# Patient Record
Sex: Female | Born: 1960 | Race: Black or African American | Hispanic: No | Marital: Single | Smoking: Never smoker
Health system: Southern US, Community
[De-identification: ages and names within clinical notes are randomized; demographics above are authoritative.]

## PROBLEM LIST (undated history)

## (undated) DIAGNOSIS — C569 Malignant neoplasm of unspecified ovary: Secondary | ICD-10-CM

## (undated) HISTORY — PX: OTHER SURGICAL HISTORY: SHX169

## (undated) HISTORY — PX: MYOMECTOMY: SHX85

## (undated) HISTORY — PX: COLON RESECTION: SHX5231

## (undated) HISTORY — PX: DILATION AND CURETTAGE OF UTERUS: SHX78

---

## 2021-01-12 ENCOUNTER — Encounter (HOSPITAL_COMMUNITY): Payer: Self-pay | Admitting: *Deleted

## 2021-01-12 ENCOUNTER — Other Ambulatory Visit: Payer: Self-pay

## 2021-01-12 ENCOUNTER — Emergency Department (HOSPITAL_COMMUNITY): Payer: Medicare Other

## 2021-01-12 ENCOUNTER — Inpatient Hospital Stay (HOSPITAL_COMMUNITY)
Admission: EM | Admit: 2021-01-12 | Discharge: 2021-01-17 | DRG: 193 | Disposition: A | Discharge status: Hospice | Payer: Medicare Other | Attending: Internal Medicine | Admitting: Internal Medicine

## 2021-01-12 DIAGNOSIS — C770 Secondary and unspecified malignant neoplasm of lymph nodes of head, face and neck: Secondary | ICD-10-CM | POA: Diagnosis present

## 2021-01-12 DIAGNOSIS — Y95 Nosocomial condition: Secondary | ICD-10-CM | POA: Diagnosis present

## 2021-01-12 DIAGNOSIS — Z881 Allergy status to other antibiotic agents status: Secondary | ICD-10-CM

## 2021-01-12 DIAGNOSIS — Z79899 Other long term (current) drug therapy: Secondary | ICD-10-CM

## 2021-01-12 DIAGNOSIS — K219 Gastro-esophageal reflux disease without esophagitis: Secondary | ICD-10-CM | POA: Diagnosis present

## 2021-01-12 DIAGNOSIS — J189 Pneumonia, unspecified organism: Principal | ICD-10-CM | POA: Diagnosis present

## 2021-01-12 DIAGNOSIS — Z803 Family history of malignant neoplasm of breast: Secondary | ICD-10-CM

## 2021-01-12 DIAGNOSIS — Z882 Allergy status to sulfonamides status: Secondary | ICD-10-CM

## 2021-01-12 DIAGNOSIS — J9601 Acute respiratory failure with hypoxia: Secondary | ICD-10-CM | POA: Diagnosis present

## 2021-01-12 DIAGNOSIS — C785 Secondary malignant neoplasm of large intestine and rectum: Secondary | ICD-10-CM | POA: Diagnosis present

## 2021-01-12 DIAGNOSIS — E785 Hyperlipidemia, unspecified: Secondary | ICD-10-CM | POA: Diagnosis present

## 2021-01-12 DIAGNOSIS — I313 Pericardial effusion (noninflammatory): Secondary | ICD-10-CM | POA: Diagnosis present

## 2021-01-12 DIAGNOSIS — K5090 Crohn's disease, unspecified, without complications: Secondary | ICD-10-CM | POA: Diagnosis present

## 2021-01-12 DIAGNOSIS — F419 Anxiety disorder, unspecified: Secondary | ICD-10-CM | POA: Diagnosis present

## 2021-01-12 DIAGNOSIS — R1310 Dysphagia, unspecified: Secondary | ICD-10-CM | POA: Diagnosis present

## 2021-01-12 DIAGNOSIS — D709 Neutropenia, unspecified: Secondary | ICD-10-CM | POA: Diagnosis present

## 2021-01-12 DIAGNOSIS — Z20822 Contact with and (suspected) exposure to covid-19: Secondary | ICD-10-CM | POA: Diagnosis present

## 2021-01-12 DIAGNOSIS — Z91048 Other nonmedicinal substance allergy status: Secondary | ICD-10-CM

## 2021-01-12 DIAGNOSIS — Z8249 Family history of ischemic heart disease and other diseases of the circulatory system: Secondary | ICD-10-CM

## 2021-01-12 DIAGNOSIS — R590 Localized enlarged lymph nodes: Secondary | ICD-10-CM | POA: Diagnosis present

## 2021-01-12 DIAGNOSIS — Z66 Do not resuscitate: Secondary | ICD-10-CM | POA: Diagnosis present

## 2021-01-12 DIAGNOSIS — Z7951 Long term (current) use of inhaled steroids: Secondary | ICD-10-CM

## 2021-01-12 DIAGNOSIS — C7800 Secondary malignant neoplasm of unspecified lung: Secondary | ICD-10-CM | POA: Diagnosis present

## 2021-01-12 DIAGNOSIS — Z933 Colostomy status: Secondary | ICD-10-CM

## 2021-01-12 DIAGNOSIS — C773 Secondary and unspecified malignant neoplasm of axilla and upper limb lymph nodes: Secondary | ICD-10-CM | POA: Diagnosis present

## 2021-01-12 DIAGNOSIS — I248 Other forms of acute ischemic heart disease: Secondary | ICD-10-CM | POA: Diagnosis present

## 2021-01-12 DIAGNOSIS — Z515 Encounter for palliative care: Secondary | ICD-10-CM

## 2021-01-12 DIAGNOSIS — R000 Tachycardia, unspecified: Secondary | ICD-10-CM | POA: Diagnosis present

## 2021-01-12 DIAGNOSIS — Z823 Family history of stroke: Secondary | ICD-10-CM

## 2021-01-12 DIAGNOSIS — Z8543 Personal history of malignant neoplasm of ovary: Secondary | ICD-10-CM

## 2021-01-12 DIAGNOSIS — Z888 Allergy status to other drugs, medicaments and biological substances status: Secondary | ICD-10-CM

## 2021-01-12 HISTORY — DX: Malignant neoplasm of unspecified ovary: C56.9

## 2021-01-12 LAB — COMPREHENSIVE METABOLIC PANEL
ALT: 18 U/L (ref 0–44)
AST: 25 U/L (ref 15–41)
Albumin: 2.6 g/dL — ABNORMAL LOW (ref 3.5–5.0)
Alkaline Phosphatase: 74 U/L (ref 38–126)
Anion gap: 11 (ref 5–15)
BUN: 5 mg/dL — ABNORMAL LOW (ref 8–23)
CO2: 25 mmol/L (ref 22–32)
Calcium: 8.9 mg/dL (ref 8.9–10.3)
Chloride: 99 mmol/L (ref 98–111)
Creatinine, Ser: 0.6 mg/dL (ref 0.44–1.00)
GFR, Estimated: >60 mL/min (ref >60)
Glucose, Bld: 97 mg/dL (ref 70–99)
Potassium: 3.9 mmol/L (ref 3.5–5.1)
Sodium: 135 mmol/L (ref 135–145)
Total Bilirubin: 0.5 mg/dL (ref 0.3–1.2)
Total Protein: 6.9 g/dL (ref 6.5–8.1)

## 2021-01-12 LAB — CBC WITH DIFFERENTIAL/PLATELET
Abs Immature Granulocytes: 0.04 10*3/uL (ref 0.00–0.07)
Basophils Absolute: 0 10*3/uL (ref 0.0–0.1)
Basophils Relative: 0 %
Eosinophils Absolute: 0 10*3/uL (ref 0.0–0.5)
Eosinophils Relative: 0 %
HCT: 45.4 % (ref 36.0–46.0)
Hemoglobin: 14.2 g/dL (ref 12.0–15.0)
Immature Granulocytes: 1 %
Lymphocytes Relative: 6 %
Lymphs Abs: 0.4 10*3/uL — ABNORMAL LOW (ref 0.7–4.0)
MCH: 28.2 pg (ref 26.0–34.0)
MCHC: 31.3 g/dL (ref 30.0–36.0)
MCV: 90.1 fL (ref 80.0–100.0)
Monocytes Absolute: 0.8 10*3/uL (ref 0.1–1.0)
Monocytes Relative: 11 %
Neutro Abs: 6.1 10*3/uL (ref 1.7–7.7)
Neutrophils Relative %: 82 %
Platelets: 310 10*3/uL (ref 150–400)
RBC: 5.04 MIL/uL (ref 3.87–5.11)
RDW: 15.6 % — ABNORMAL HIGH (ref 11.5–15.5)
WBC: 7.5 10*3/uL (ref 4.0–10.5)
nRBC: 0 % (ref 0.0–0.2)

## 2021-01-12 LAB — TROPONIN I (HIGH SENSITIVITY): Troponin I (High Sensitivity): 31 ng/L — ABNORMAL HIGH (ref <18)

## 2021-01-12 LAB — PROTIME-INR
INR: 1.2 (ref 0.8–1.2)
Prothrombin Time: 14.7 seconds (ref 11.4–15.2)

## 2021-01-12 LAB — LACTIC ACID, PLASMA: Lactic Acid, Venous: 1.6 mmol/L (ref 0.5–1.9)

## 2021-01-12 MED ORDER — ONDANSETRON HCL 4 MG/2ML IJ SOLN
4.0000 mg | Freq: Once | INTRAMUSCULAR | Status: AC
  Administered 2021-01-13: 4 mg via INTRAVENOUS
  Filled 2021-01-12: qty 2

## 2021-01-12 MED ORDER — HYDROMORPHONE HCL 1 MG/ML IJ SOLN
1.0000 mg | Freq: Once | INTRAMUSCULAR | Status: AC
  Administered 2021-01-13: 1 mg via INTRAVENOUS
  Filled 2021-01-12: qty 1

## 2021-01-12 MED ORDER — LACTATED RINGERS IV BOLUS
1000.0000 mL | Freq: Once | INTRAVENOUS | Status: AC
  Administered 2021-01-13: 1000 mL via INTRAVENOUS

## 2021-01-12 NOTE — ED Provider Notes (Signed)
[facility] EMERGENCY DEPARTMENT Provider Note   CSN: [id number] Arrival date & time: 01/12/21  2116     History Chief Complaint  Patient presents with   Shortness of Breath   Level 5 caveat due to acuity of condition Chelsea Mcintosh is a 60 y.o. female.  The history is provided by the patient and a parent.  Shortness of Breath Severity:  Severe Onset quality:  Gradual Duration:  3 months Timing:  Constant Progression:  Worsening Chronicity:  Recurrent Relieved by:  Nothing Worsened by:  Nothing Associated symptoms: chest pain and cough   Associated symptoms: no fever   Patient with history of metastatic ovarian cancer presents with shortness of breath and chest pain.  Patient reports he has had the symptoms ongoing for several months.  She just relocated from [place] here to [place] in the past week.  She reports hospitalist was post to visit her but they did not show up.  She reports that she is on home oxygen    Past Medical History:  Diagnosis Date   Ovarian cancer ([facility] [facility])       OB History   No obstetric history on file.     No family history on file.     Home Medications Prior to Admission medications   Not on File    Allergies    Patient has no allergy information on record.  Review of Systems   Review of Systems  Unable to perform ROS: Acuity of condition  Constitutional:  Negative for fever.  Respiratory:  Positive for cough and shortness of breath.   Cardiovascular:  Positive for chest pain.   Physical Exam Updated Vital Signs BP (!) 133/91   Pulse (!) 125   Temp 98 F (36.7 C) (Oral)   Resp 20   SpO2 98%   Physical Exam CONSTITUTIONAL: Ill-appearing, uncomfortable appearing HEAD: Normocephalic/atraumatic EYES: EOMI/PERRL ENMT: Mucous membranes moist NECK: supple no meningeal signs SPINE/BACK:entire spine nontender CV: S1/S2 noted, tachycardic LUNGS: Tachypneic, coarse breath sounds bilaterally ABDOMEN:  soft, nontender, no rebound or guarding, colostomy noted NEURO: Pt is awake/alert/appropriate, moves all extremitiesx4.  No facial droop.   EXTREMITIES: pulses normal/equal, full ROM SKIN: warm, color normal PSYCH: Anxious  ED Results / Procedures / Treatments   Labs (all labs ordered are listed, but only abnormal results are displayed) Labs Reviewed  COMPREHENSIVE METABOLIC PANEL - Abnormal; Notable for the following components:      Result Value   BUN 5 (*)    Albumin 2.6 (*)    All other components within normal limits  CBC WITH DIFFERENTIAL/PLATELET - Abnormal; Notable for the following components:   RDW 15.6 (*)    Lymphs Abs 0.4 (*)    All other components within normal limits  TROPONIN I (HIGH SENSITIVITY) - Abnormal; Notable for the following components:   Troponin I (High Sensitivity) 31 (*)    All other components within normal limits  CULTURE, BLOOD (ROUTINE X 2)  CULTURE, BLOOD (ROUTINE X 2)  RESP PANEL BY RT-PCR (FLU A&B, COVID) ARPGX2  LACTIC ACID, PLASMA  BRAIN NATRIURETIC PEPTIDE  PROTIME-INR    EKG EKG Interpretation  Date/Time:  Wednesday January 12 2021 22:00:01 EDT Ventricular Rate:  125 PR Interval:  134 QRS Duration: 60 QT Interval:  300 QTC Calculation: 433 R Axis:   91 Text Interpretation: Sinus tachycardia Rightward axis Low voltage QRS Septal infarct , age undetermined Abnormal ECG No previous ECGs available Confirmed by Ripley Fraise [phone]) on 01/12/2021 11:36:16  PM  Radiology DG Chest 2 View  Result Date: 01/12/2021 CLINICAL DATA:  History of stage IV ovarian cancer presenting with shortness of breath. EXAM: CHEST - 2 VIEW COMPARISON:  None. FINDINGS: A right-sided venous Port-A-Cath is seen with its distal tip noted within the right atrium. This is approximately 2.5 cm distal to the junction of the superior vena cava and right atrium. Innumerable ill-defined multifocal opacities are seen scattered throughout both lungs. Mild atelectasis and/or  infiltrate is also seen within the lateral aspect of the right lung base. A very small right pleural effusion is seen. No pneumothorax is identified. The heart size and mediastinal contours are within normal limits. The visualized skeletal structures are unremarkable. IMPRESSION: 1. Findings which may represent marked severity bilateral multifocal infiltrates. Given the patient's history of stage IV ovarian cancer, sequelae associated with diffuse pulmonary metastasis cannot be excluded. 2. Mild right basilar atelectasis and/or infiltrate with a very small right pleural effusion. Electronically Signed   By: Virgina Norfolk M.D.   On: 01/12/2021 22:41    Procedures .Critical Care  Date/Time: 01/13/2021 12:59 AM Performed by: Ripley Fraise, MD Authorized by: Ripley Fraise, MD   Critical care provider statement:    Critical care time (minutes):  31   Critical care start time:  01/13/2021 12:59 AM   Critical care end time:  01/13/2021 1:30 AM   Critical care time was exclusive of:  Separately billable procedures and treating other patients   Critical care was necessary to treat or prevent imminent or life-threatening deterioration of the following conditions:  Respiratory failure   Critical care was time spent personally by me on the following activities:  Development of treatment plan with patient or surrogate, discussions with consultants, evaluation of patient's response to treatment, examination of patient, re-evaluation of patient's condition, ordering and review of radiographic studies, ordering and review of laboratory studies, pulse oximetry and ordering and performing treatments and interventions   I assumed direction of critical care for this patient from another provider in my specialty: no     Care discussed with: admitting provider     Medications Ordered in ED Medications  Ampicillin-Sulbactam (UNASYN) 3 g in sodium chloride 0.9 % 100 mL IVPB (has no administration in time range)   lactated ringers bolus 1,000 mL (1,000 mLs Intravenous New Bag/Given 01/13/21 0025)  HYDROmorphone (DILAUDID) injection 1 mg (1 mg Intravenous Given 01/13/21 0019)  ondansetron (ZOFRAN) injection 4 mg (4 mg Intravenous Given 01/13/21 0019)  LORazepam (ATIVAN) injection 0.5 mg (0.5 mg Intravenous Given 01/13/21 0058)    ED Course  I have reviewed the triage vital signs and the nursing notes.  Pertinent labs & imaging results that were available during my care of the patient were reviewed by me and considered in my medical decision making (see chart for details).    MDM Rules/Calculators/A&P                           Patient with history of metastatic ovarian cancer presents with shortness of breath.  She reports symptoms been ongoing for several months.  She reports significant pain in her chest and back but this is not a new finding.  Patient appears very uncomfortable. She does appear to have diffuse metastatic disease on chest x-ray that I personally reviewed Plan to treat her pain and reassess. 1:00 AM Patient is now improving.  Patient reports she has a history of metastatic ovarian cancer.  She has had  extensive evaluation and management in [place].  She reports she was recently admitted to the hospital [place] was found to have a "lung infection" after thoracentesis and has been on IV antibiotics.  She also had a new oxygen requirement while in hospital.  The decision was made by patient to relocate to [place] to be with her family.  She was discharged home on Augmentin and she reports that she intended to be seen by hospice but that never occurred.  She is also not on home oxygen as of yet. She reports as far she knows she intends to be on continued cancer therapy but is also amenable to palliative consult.  Her records has not come through yet on care everywhere.  We will transition from Augmentin to IV Unasyn.  Patient does have a new oxygen requirement as she initially arrived  satting on percent on room air that this is likely related to her metastasis/infection rather than acute PE. Overall patient appears more comfortable after receiving IV pain medicines. 2:04 AM Discussed with Dr. Nevada Crane for admission. Overall patient feels more comfortable.  COVID testing is pending at this time Final Clinical Impression(s) / ED Diagnoses Final diagnoses:  Acute respiratory failure with hypoxia ([place])  Secondary carcinoma of lung, unspecified laterality [facility])    Rx / DC Orders ED Discharge Orders     None        Ripley Fraise, MD 01/13/21 0205

## 2021-01-12 NOTE — ED Provider Notes (Signed)
Emergency Medicine Provider Triage Evaluation Note  Chelsea Mcintosh , a 60 y.o. female  was evaluated in triage.  Pt complains of shortness of breath, going on for last couple months, she has stage IV ovarian cancer, currently relocating from [place] down to [place].  She was supposed to follow-up with hospice care but was unable to do so, currently a full code, she states that she is currently being treated for pneumonia, she is on amoxicillin at this time, she is not on oxygen at home, she was found in triage today on room air with a sat of 79%, currently on 99% room air 4 L via nasal cannula.  Review of Systems  Positive: Shortness of breath, back pain Negative: Chest pain, abdominal pain  Physical Exam  BP (!) 120/91 (BP Location: Left Arm)   Pulse (!) 130   Resp (!) 24   SpO2 94%  Gen:   Awake, no distress   Resp:  Normal effort  MSK:   Moves extremities without difficulty  Other:  Patient has tight sounding chest, slight basilar Rales in the lower lobes  Medical Decision Making  Medically screening exam initiated at 9:50 PM.  Appropriate orders placed.  KARENZA ELBON was informed that the remainder of the evaluation will be completed by another provider, this initial triage assessment does not replace that evaluation, and the importance of remaining in the ED until their evaluation is complete.  Patient presented with shortness of breath, will make her a level 2 recommend immediate rooming as the patient will need further evaluation.   Marcello Fennel, PA-C 01/12/21 2154    Pattricia Boss, MD 01/12/21 [phone]

## 2021-01-12 NOTE — ED Notes (Signed)
I attempted to get and IV on pt, but unable to. Another nurse to access pt's port. Pt's pain 10/10. Pt was supposed to get set up with hospice, but they never showed up. Pt AxO x4.

## 2021-01-12 NOTE — ED Triage Notes (Signed)
Pt reports she has shortness of breath tonight. She has hx of metastatic ovarian CA, in hospice care. On current abx for infection in her lungs.  Arrives on RA, 79% RA reported by first nurse tech, placed on oxygen. Suppose to be on oxygen, but not using it.   99% on 4 liters, hr 130 in triage, MSE complete by PA.

## 2021-01-13 ENCOUNTER — Inpatient Hospital Stay (HOSPITAL_COMMUNITY): Payer: Medicare Other

## 2021-01-13 DIAGNOSIS — J189 Pneumonia, unspecified organism: Secondary | ICD-10-CM | POA: Diagnosis present

## 2021-01-13 DIAGNOSIS — Z515 Encounter for palliative care: Secondary | ICD-10-CM | POA: Diagnosis not present

## 2021-01-13 DIAGNOSIS — Z823 Family history of stroke: Secondary | ICD-10-CM | POA: Diagnosis not present

## 2021-01-13 DIAGNOSIS — C7800 Secondary malignant neoplasm of unspecified lung: Secondary | ICD-10-CM | POA: Diagnosis present

## 2021-01-13 DIAGNOSIS — E785 Hyperlipidemia, unspecified: Secondary | ICD-10-CM | POA: Diagnosis present

## 2021-01-13 DIAGNOSIS — Y95 Nosocomial condition: Secondary | ICD-10-CM | POA: Diagnosis present

## 2021-01-13 DIAGNOSIS — Z888 Allergy status to other drugs, medicaments and biological substances status: Secondary | ICD-10-CM | POA: Diagnosis not present

## 2021-01-13 DIAGNOSIS — R0602 Shortness of breath: Secondary | ICD-10-CM

## 2021-01-13 DIAGNOSIS — C773 Secondary and unspecified malignant neoplasm of axilla and upper limb lymph nodes: Secondary | ICD-10-CM | POA: Diagnosis present

## 2021-01-13 DIAGNOSIS — F419 Anxiety disorder, unspecified: Secondary | ICD-10-CM | POA: Diagnosis present

## 2021-01-13 DIAGNOSIS — R778 Other specified abnormalities of plasma proteins: Secondary | ICD-10-CM | POA: Diagnosis not present

## 2021-01-13 DIAGNOSIS — C569 Malignant neoplasm of unspecified ovary: Secondary | ICD-10-CM | POA: Diagnosis not present

## 2021-01-13 DIAGNOSIS — I248 Other forms of acute ischemic heart disease: Secondary | ICD-10-CM | POA: Diagnosis present

## 2021-01-13 DIAGNOSIS — Z20822 Contact with and (suspected) exposure to covid-19: Secondary | ICD-10-CM | POA: Diagnosis present

## 2021-01-13 DIAGNOSIS — K5090 Crohn's disease, unspecified, without complications: Secondary | ICD-10-CM | POA: Diagnosis present

## 2021-01-13 DIAGNOSIS — C770 Secondary and unspecified malignant neoplasm of lymph nodes of head, face and neck: Secondary | ICD-10-CM | POA: Diagnosis present

## 2021-01-13 DIAGNOSIS — Z8249 Family history of ischemic heart disease and other diseases of the circulatory system: Secondary | ICD-10-CM | POA: Diagnosis not present

## 2021-01-13 DIAGNOSIS — J9601 Acute respiratory failure with hypoxia: Secondary | ICD-10-CM | POA: Diagnosis present

## 2021-01-13 DIAGNOSIS — Z933 Colostomy status: Secondary | ICD-10-CM | POA: Diagnosis not present

## 2021-01-13 DIAGNOSIS — R079 Chest pain, unspecified: Secondary | ICD-10-CM | POA: Diagnosis not present

## 2021-01-13 DIAGNOSIS — Z803 Family history of malignant neoplasm of breast: Secondary | ICD-10-CM | POA: Diagnosis not present

## 2021-01-13 DIAGNOSIS — C801 Malignant (primary) neoplasm, unspecified: Secondary | ICD-10-CM | POA: Diagnosis not present

## 2021-01-13 DIAGNOSIS — Z882 Allergy status to sulfonamides status: Secondary | ICD-10-CM | POA: Diagnosis not present

## 2021-01-13 DIAGNOSIS — Z91048 Other nonmedicinal substance allergy status: Secondary | ICD-10-CM | POA: Diagnosis not present

## 2021-01-13 DIAGNOSIS — C785 Secondary malignant neoplasm of large intestine and rectum: Secondary | ICD-10-CM | POA: Diagnosis present

## 2021-01-13 DIAGNOSIS — Z66 Do not resuscitate: Secondary | ICD-10-CM | POA: Diagnosis present

## 2021-01-13 DIAGNOSIS — D709 Neutropenia, unspecified: Secondary | ICD-10-CM | POA: Diagnosis present

## 2021-01-13 DIAGNOSIS — C799 Secondary malignant neoplasm of unspecified site: Secondary | ICD-10-CM | POA: Diagnosis not present

## 2021-01-13 DIAGNOSIS — R1310 Dysphagia, unspecified: Secondary | ICD-10-CM | POA: Diagnosis present

## 2021-01-13 DIAGNOSIS — Z7189 Other specified counseling: Secondary | ICD-10-CM | POA: Diagnosis not present

## 2021-01-13 DIAGNOSIS — Z881 Allergy status to other antibiotic agents status: Secondary | ICD-10-CM | POA: Diagnosis not present

## 2021-01-13 DIAGNOSIS — I313 Pericardial effusion (noninflammatory): Secondary | ICD-10-CM | POA: Diagnosis present

## 2021-01-13 LAB — ECHOCARDIOGRAM COMPLETE
Area-P 1/2: 6.71 cm2
Calc EF: 67.9 %
Height: 63 in
S' Lateral: 2 cm
Single Plane A2C EF: 72.1 %
Single Plane A4C EF: 56 %
Weight: 2240 oz

## 2021-01-13 LAB — BASIC METABOLIC PANEL
Anion gap: 10 (ref 5–15)
BUN: <5 mg/dL — ABNORMAL LOW (ref 8–23)
CO2: 23 mmol/L (ref 22–32)
Calcium: 8.2 mg/dL — ABNORMAL LOW (ref 8.9–10.3)
Chloride: 102 mmol/L (ref 98–111)
Creatinine, Ser: 0.6 mg/dL (ref 0.44–1.00)
GFR, Estimated: >60 mL/min (ref >60)
Glucose, Bld: 101 mg/dL — ABNORMAL HIGH (ref 70–99)
Potassium: 3.8 mmol/L (ref 3.5–5.1)
Sodium: 135 mmol/L (ref 135–145)

## 2021-01-13 LAB — CBC
HCT: 40.5 % (ref 36.0–46.0)
Hemoglobin: 12.3 g/dL (ref 12.0–15.0)
MCH: 27.4 pg (ref 26.0–34.0)
MCHC: 30.4 g/dL (ref 30.0–36.0)
MCV: 90.2 fL (ref 80.0–100.0)
Platelets: 272 10*3/uL (ref 150–400)
RBC: 4.49 MIL/uL (ref 3.87–5.11)
RDW: 15.6 % — ABNORMAL HIGH (ref 11.5–15.5)
WBC: 7.7 10*3/uL (ref 4.0–10.5)
nRBC: 0 % (ref 0.0–0.2)

## 2021-01-13 LAB — BRAIN NATRIURETIC PEPTIDE: B Natriuretic Peptide: 73.4 pg/mL (ref 0.0–100.0)

## 2021-01-13 LAB — HIV ANTIBODY (ROUTINE TESTING W REFLEX): HIV Screen 4th Generation wRfx: NONREACTIVE

## 2021-01-13 LAB — RESP PANEL BY RT-PCR (FLU A&B, COVID) ARPGX2
Influenza A by PCR: NEGATIVE
Influenza B by PCR: NEGATIVE
SARS Coronavirus 2 by RT PCR: NEGATIVE

## 2021-01-13 LAB — TROPONIN I (HIGH SENSITIVITY): Troponin I (High Sensitivity): 18 ng/L — ABNORMAL HIGH (ref <18)

## 2021-01-13 LAB — PROCALCITONIN: Procalcitonin: <0.1 ng/mL

## 2021-01-13 MED ORDER — POLYETHYLENE GLYCOL 3350 17 G PO PACK: 17.0000 g | PACK | Freq: Every day | ORAL | Status: DC | PRN

## 2021-01-13 MED ORDER — SODIUM CHLORIDE 0.9 % IV SOLN
3.0000 g | Freq: Four times a day (QID) | INTRAVENOUS | Status: DC
  Administered 2021-01-13 – 2021-01-17 (×17): 3 g via INTRAVENOUS
  Filled 2021-01-13 (×3): qty 8
  Filled 2021-01-13: qty 3
  Filled 2021-01-13: qty 8
  Filled 2021-01-13 (×4): qty 3
  Filled 2021-01-13 (×11): qty 8

## 2021-01-13 MED ORDER — SODIUM CHLORIDE 0.9 % IV SOLN
3.0000 g | Freq: Once | INTRAVENOUS | Status: AC
  Administered 2021-01-13: 3 g via INTRAVENOUS
  Filled 2021-01-13: qty 8

## 2021-01-13 MED ORDER — NALOXONE HCL 0.4 MG/ML IJ SOLN: 0.4000 mg | INTRAMUSCULAR | Status: DC | PRN

## 2021-01-13 MED ORDER — LORAZEPAM 2 MG/ML IJ SOLN
0.5000 mg | Freq: Four times a day (QID) | INTRAMUSCULAR | Status: DC | PRN
  Administered 2021-01-13 – 2021-01-15 (×7): 0.5 mg via INTRAVENOUS
  Filled 2021-01-13 (×7): qty 1

## 2021-01-13 MED ORDER — LORAZEPAM 2 MG/ML IJ SOLN
0.5000 mg | Freq: Once | INTRAMUSCULAR | Status: AC
  Administered 2021-01-13: 0.5 mg via INTRAVENOUS
  Filled 2021-01-13: qty 1

## 2021-01-13 MED ORDER — OXYCODONE HCL 5 MG PO TABS
5.0000 mg | ORAL_TABLET | Freq: Four times a day (QID) | ORAL | Status: DC | PRN
  Administered 2021-01-14 (×3): 5 mg via ORAL
  Filled 2021-01-13 (×4): qty 1

## 2021-01-13 MED ORDER — ENOXAPARIN SODIUM 40 MG/0.4ML IJ SOSY
40.0000 mg | PREFILLED_SYRINGE | INTRAMUSCULAR | Status: DC
  Administered 2021-01-13 – 2021-01-17 (×5): 40 mg via SUBCUTANEOUS
  Filled 2021-01-13 (×5): qty 0.4

## 2021-01-13 MED ORDER — MELATONIN 3 MG PO TABS: 3.0000 mg | ORAL_TABLET | Freq: Every evening | ORAL | Status: DC | PRN

## 2021-01-13 MED ORDER — ACETAMINOPHEN 325 MG PO TABS: 650.0000 mg | ORAL_TABLET | Freq: Four times a day (QID) | ORAL | Status: DC | PRN

## 2021-01-13 MED ORDER — ONDANSETRON HCL 4 MG/2ML IJ SOLN: 4.0000 mg | Freq: Four times a day (QID) | INTRAMUSCULAR | Status: DC | PRN

## 2021-01-13 MED ORDER — HYDROMORPHONE HCL 1 MG/ML IJ SOLN
1.0000 mg | INTRAMUSCULAR | Status: DC | PRN
  Administered 2021-01-13 – 2021-01-16 (×26): 1 mg via INTRAVENOUS
  Filled 2021-01-13 (×27): qty 1

## 2021-01-13 MED ORDER — METOPROLOL TARTRATE 5 MG/5ML IV SOLN
2.5000 mg | Freq: Four times a day (QID) | INTRAVENOUS | Status: DC | PRN
  Administered 2021-01-15: 2.5 mg via INTRAVENOUS
  Filled 2021-01-13: qty 5

## 2021-01-13 NOTE — Progress Notes (Signed)
Patient ID: Chelsea Mcintosh, female   DOB: Oct 26, 1960, 60 y.o.   MRN: [id number] Patient was admitted early this morning for shortness of breath, chest and back pain.She was admitted for multifocal pneumonia and started on IV Unasyn.  Patient seen and examined at bedside and plan of care discussed with her.  I have reviewed patient's medical records including this morning's H&P, current vitals, labs, medications myself.  Continue oxygen supplementation and antibiotics.  Palliative care consultation for goals of care discussion as patient apparently was previously supposed to be enrolled to hospice care in [place].  Patient is currently full code.  Patient will need to establish outpatient oncology locally.

## 2021-01-13 NOTE — ED Notes (Signed)
Lights turned off for pt. Pt readjusted in bed. Denies further needs. Pt instructed to call if pain starts to get worse again. Waiting for unasyn from pharmacy.

## 2021-01-13 NOTE — ED Notes (Signed)
Pt requesting ativan for anxiety. Dr. Christy Gentles notified. See new orders. Dr. Also said pt can eat/drink. Given water.

## 2021-01-13 NOTE — H&P (Addendum)
History and Physical  Chelsea Mcintosh [id number] DOB: Oct 19, 1960 DOA: 01/12/2021  Referring physician: Dr. Christy Gentles, [place]. PCP: System, Provider Not In  Outpatient Specialists: Hematology/oncology Patient coming from: Home, lives with her parents.  Recently moved to [facility] 4 days prior to presentation on 01/10/2021.  Chief Complaint: Shortness of breath, chest and back pain.  HPI: Chelsea Mcintosh is a 60 y.o. female with medical history significant for ovarian cancer with diffuse metastasis previously under hospice care and recently admitted at outside facility in [place].  She states she was found to have pneumonia there, was seen by infectious disease and released from outside facility with Augmentin.  She was also told that she needed to be on 2 L oxygen via nasal cannula however was discharged without supplemental oxygen.  She moved to [facility] to be with her parents who assist in caring for her.  States she has 1 child, her son, who lives in [place] is an Alcoholic and Has Not Been Able to Assist with Her Care.  Upon arrival to the ED she was found to be hypoxic with O2 saturation in the high 70s on room air.  She was placed on 4 L with improvement to 99%.  Bilateral pulmonary infiltrates seen on CXR.  EDP requested admission for pneumonia and hypoxia.  ED Course:  Temperature 98.6.  BP 128/84, pulse 116, respiration rate 21, O2 saturation 96% on 4 L.  Review of Systems: Review of systems as noted in the HPI. All other systems reviewed and are negative.   Past Medical History:  Diagnosis Date   Ovarian cancer [facility])      Social History:  has no history on file for tobacco use, alcohol use, and drug use.   Family history: Her son is an alcoholic.   Prior to Admission medications   Not on File    Physical Exam: BP (!) 151/90 (BP Location: Right Arm)   Pulse (!) 120   Temp 98 F (36.7 C) (Oral)   Resp (!) 29   SpO2 97%   General: 60 y.o.  year-old female well developed well nourished in no acute distress.  Alert and oriented x3.  Conversational dyspnea noted. Cardiovascular: Regular rate and rhythm with no rubs or gallops.  No thyromegaly or JVD noted.  Trace lower extremity edema bilaterally.  Nontender with palpation.   Respiratory: Diffuse rales bilaterally.  No wheezing noted.  Poor inspiratory effort.   Abdomen: Soft nontender nondistended with normal bowel sounds x4 quadrants. Muskuloskeletal: No cyanosis or clubbing.  Trace lower extremity edema bilaterally.  Nontender with palpation. Neuro: CN II-XII intact, strength, sensation, reflexes Skin: No ulcerative lesions noted or rashes Psychiatry: Judgement and insight appear normal. Mood is appropriate for condition and setting          Labs on Admission:  Basic Metabolic Panel: Recent Labs  Lab 01/12/21 2207  NA 135  K 3.9  CL 99  CO2 25  GLUCOSE 97  BUN 5*  CREATININE 0.60  CALCIUM 8.9   Liver Function Tests: Recent Labs  Lab 01/12/21 2207  AST 25  ALT 18  ALKPHOS 74  BILITOT 0.5  PROT 6.9  ALBUMIN 2.6*   No results for input(s): LIPASE, AMYLASE in the last 168 hours. No results for input(s): AMMONIA in the last 168 hours. CBC: Recent Labs  Lab 01/12/21 2207  WBC 7.5  NEUTROABS 6.1  HGB 14.2  HCT 45.4  MCV 90.1  PLT 310   Cardiac Enzymes: No results  for input(s): CKTOTAL, CKMB, CKMBINDEX, TROPONINI in the last 168 hours.  BNP (last 3 results) Recent Labs    01/12/21 2207  BNP 73.4    ProBNP (last 3 results) No results for input(s): PROBNP in the last 8760 hours.  CBG: No results for input(s): GLUCAP in the last 168 hours.  Radiological Exams on Admission: DG Chest 2 View  Result Date: 01/12/2021 CLINICAL DATA:  History of stage IV ovarian cancer presenting with shortness of breath. EXAM: CHEST - 2 VIEW COMPARISON:  None. FINDINGS: A right-sided venous Port-A-Cath is seen with its distal tip noted within the right atrium. This  is approximately 2.5 cm distal to the junction of the superior vena cava and right atrium. Innumerable ill-defined multifocal opacities are seen scattered throughout both lungs. Mild atelectasis and/or infiltrate is also seen within the lateral aspect of the right lung base. A very small right pleural effusion is seen. No pneumothorax is identified. The heart size and mediastinal contours are within normal limits. The visualized skeletal structures are unremarkable. IMPRESSION: 1. Findings which may represent marked severity bilateral multifocal infiltrates. Given the patient's history of stage IV ovarian cancer, sequelae associated with diffuse pulmonary metastasis cannot be excluded. 2. Mild right basilar atelectasis and/or infiltrate with a very small right pleural effusion. Electronically Signed   By: Virgina Norfolk M.D.   On: 01/12/2021 22:41    EKG: I independently viewed the EKG done and my findings are as followed: Sinus tachycardia rate of 125, nonspecific ST-T changes.  QTc 433.  Assessment/Plan Present on Admission:  HCAP (healthcare-associated pneumonia)  Active Problems:   HCAP (healthcare-associated pneumonia)  Multifocal HCAP, POA Recently seen at outside facility due to similar symptoms Started on Augmentin there Chest x-ray with evidence of multifocal pneumonia. Started on Unasyn in the ED, continue. Obtain procalcitonin level Sputum culture Bronchodilators Antitussives as needed's Incentive spirometer  Acute hypoxic respiratory failure secondary to multifocal pneumonia Not on oxygen supplementation at baseline Currently on 4 L to maintain O2 saturation greater than 92% Wean off oxygen supplementation as tolerated Treat underlying conditions COVID-19 screening test negative. Consider CTA chest to rule out PE if no improvement  Ovarian cancer with mets to the lungs. Consult palliative care team to assist with establishing goals of care. Patient is currently a full  code Patient is planning on staying in [facility] hematology oncology in the morning. Obtain medical records from [place].  Elevated troponin, suspect demand ischemia in the setting of acute hypoxic respiratory failure secondary to multifocal pneumonia. Initial troponin 31 Repeat troponin No evidence of acute ischemia on twelve-lead EKG. Monitor on telemetry. Obtain 2D echo   To note, at the time of the dictation her home medications have not been reconciled.   Please resume home medications once reconciled.    DVT prophylaxis: Subcu Lovenox daily  Code Status: Full code  Family Communication: None at bedside  Disposition Plan: Admit to telemetry medical  Consults called: None  Admission status: Inpatient status.  Patient will require at least 2 midnights for further evaluation and treatment of present condition.   Status is: Inpatient    Dispo:  Patient From: Home  Planned Disposition: Home, possibly on 01/15/2021 or when symptomatology has improved.  Medically stable for discharge: No         Kayleen Memos MD [facility] Pager [phone]  If 7PM-7AM, please contact night-coverage [email or web address] Password [email or web address]  01/13/2021, 2:03 AM

## 2021-01-13 NOTE — Progress Notes (Signed)
  Echocardiogram 2D Echocardiogram has been performed.  Bobbye Charleston 01/13/2021, 12:03 PM

## 2021-01-13 NOTE — ED Notes (Signed)
Patient's mother is at bedside. Patient medicated for continued pain. Continues to have audible wheezing throughout lung fields. Patient has been OOB to [facility]  And transfers/ambulates with steady independent gait.

## 2021-01-13 NOTE — ED Notes (Signed)
Report given to Delora Fuel, RN

## 2021-01-13 NOTE — Progress Notes (Signed)
Manufacturing engineer [facility]) Nurse Liaison Note     This patient has been referred for hospice services but has not yet been admitted onto hospice.  [facility] will continue to follow for any discharge planning needs and to coordinate admission onto hospice care.     Thank you for the opportunity to participate in this patient's care.     Domenic Moras, BSN, RN [facility] Liaison [phone] (24h on call)

## 2021-01-13 NOTE — ED Notes (Signed)
Patient is very SOB at rest and speaks in broken sentences. Wheezes noted throughout lung fields on auscultation. Patient medicated for pain and also received Ativan as per order. Echocardiogram now in progress.

## 2021-01-14 ENCOUNTER — Encounter (HOSPITAL_COMMUNITY): Payer: Self-pay | Admitting: Internal Medicine

## 2021-01-14 ENCOUNTER — Inpatient Hospital Stay (HOSPITAL_COMMUNITY): Payer: Medicare Other

## 2021-01-14 DIAGNOSIS — I313 Pericardial effusion (noninflammatory): Secondary | ICD-10-CM

## 2021-01-14 DIAGNOSIS — C569 Malignant neoplasm of unspecified ovary: Secondary | ICD-10-CM | POA: Diagnosis not present

## 2021-01-14 DIAGNOSIS — C801 Malignant (primary) neoplasm, unspecified: Secondary | ICD-10-CM | POA: Diagnosis not present

## 2021-01-14 DIAGNOSIS — Z7189 Other specified counseling: Secondary | ICD-10-CM

## 2021-01-14 DIAGNOSIS — J189 Pneumonia, unspecified organism: Principal | ICD-10-CM

## 2021-01-14 DIAGNOSIS — C799 Secondary malignant neoplasm of unspecified site: Secondary | ICD-10-CM

## 2021-01-14 DIAGNOSIS — J9601 Acute respiratory failure with hypoxia: Secondary | ICD-10-CM

## 2021-01-14 LAB — BASIC METABOLIC PANEL
Anion gap: 9 (ref 5–15)
BUN: 5 mg/dL — ABNORMAL LOW (ref 8–23)
CO2: 28 mmol/L (ref 22–32)
Calcium: 8.7 mg/dL — ABNORMAL LOW (ref 8.9–10.3)
Chloride: 99 mmol/L (ref 98–111)
Creatinine, Ser: 0.58 mg/dL (ref 0.44–1.00)
GFR, Estimated: >60 mL/min (ref >60)
Glucose, Bld: 98 mg/dL (ref 70–99)
Potassium: 3.7 mmol/L (ref 3.5–5.1)
Sodium: 136 mmol/L (ref 135–145)

## 2021-01-14 LAB — CBC
HCT: 39.6 % (ref 36.0–46.0)
Hemoglobin: 12.1 g/dL (ref 12.0–15.0)
MCH: 27.5 pg (ref 26.0–34.0)
MCHC: 30.6 g/dL (ref 30.0–36.0)
MCV: 90 fL (ref 80.0–100.0)
Platelets: 274 10*3/uL (ref 150–400)
RBC: 4.4 MIL/uL (ref 3.87–5.11)
RDW: 15.4 % (ref 11.5–15.5)
WBC: 5.5 10*3/uL (ref 4.0–10.5)
nRBC: 0 % (ref 0.0–0.2)

## 2021-01-14 LAB — PROCALCITONIN: Procalcitonin: <0.1 ng/mL

## 2021-01-14 LAB — GLUCOSE, CAPILLARY: Glucose-Capillary: 91 mg/dL (ref 70–99)

## 2021-01-14 MED ORDER — CHLORHEXIDINE GLUCONATE CLOTH 2 % EX PADS
6.0000 | MEDICATED_PAD | Freq: Every day | CUTANEOUS | Status: DC
  Administered 2021-01-14 – 2021-01-16 (×3): 6 via TOPICAL

## 2021-01-14 MED ORDER — SODIUM CHLORIDE 0.9% FLUSH
10.0000 mL | INTRAVENOUS | Status: DC | PRN
  Administered 2021-01-17: 10 mL

## 2021-01-14 NOTE — Consult Note (Addendum)
The patient has been seen in conjunction with Almyra Deforest, PAC. All aspects of care have been considered and discussed. The patient has been personally interviewed, examined, and all clinical data has been reviewed.  Moderate pericardial effusion. In this setting, malignant etiology has to be considered. Other possibility is purulent pericarditis. She has dyspnea and elevated HR but no echo findings of tamponade. Recommend surveillance for progression. Will need f/u echo in 2-4 weeks. Alignment of goals of care is in process. Please call if we can be of further assistance.  Cardiology Consultation:   Patient ID: ZUMA DORKO MRN: [id number]; DOB: 29-Jun-1960  Admit date: 01/12/2021 Date of Consult: 01/14/2021  PCP:  System, Provider Not In   [facility] Providers Cardiologist: New to [facility] -Dr. Tamala Julian  Previous PCP in [place]: Dr. Humberto Leep Previous oncologist in [place]: Dr. Gwen Pounds Previous general surgeon in [place]: Dr. Conley Canal (unable to find info online) r   Patient Profile:   Chelsea Mcintosh is a 60 y.o. female with a hx of metastatic ovarian cancer who is being seen 01/14/2021 for the evaluation of moderate pericardial effusion seen on echocardiogram at the request of Dr. Starla Link.  History of Present Illness:   Ms. Delich is a pleasant 60 year old female with past medical history of metastatic ovarian cancer.  According to the patient, she joined [facility] (a hospital system outside [place]) back in 2013.  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  In March 2019, she started noticing worsening abdominal distention, lack of appetite, and enlarging abdomen.  Initial GI work-up was negative.  She apparently went to the local ED and found to have a CA125 of greater than 900.  She was formally diagnosed with ovarian cancer admitted in May or July 2019.  Her PCP at the time Dr. Maryjean Ka obtain echocardiogram and was told she had  normal ejection fraction.  Otherwise she does not have any cardiac issue and has never seen by a cardiologist.  Her GYN oncologist is Dr. Kathi Simpers.  She says her initial chemotherapy consist of cisplatin and taxol.  Her chemotherapy had to be pushed back multiple times due to low neutrophil?  Per patient, she later developed mass on her colon and had to have 2 sections of colon removed by Dr. Conley Canal.  Since diagnosis of cancer, she has had frequent visits to the hospital due to recurrent infection and bone pain (unclear if she had metastasis to bone).  She reached the point where she is no longer able to care for herself and her son is unable to care for either.  She eventually moved down to [place] near the end of June 2022 to stay with her parents.  Last chemotherapy about a month ago.   According to the patient, she has been having some intermittent shortness of breath for the past several months.  However she denies any fever or chill.  Per report, she was admitted at outside facility in [place] and was told to have pneumonia.  She was released from outside facility with Augmentin.  She was discharged with recommendation of home O2, however she was not given the supplemental oxygen.  She presented to the emergency room on 01/12/2021 with complaint of intermittent dizziness and shortness of breath.  On arrival, O2 saturation was in the high 70s on room air.  She was placed on 4 L oxygen with improvement to 99%.  Chest x-ray showed bilateral pulmonary infiltrate concerning for  multifocal pneumonia.  She was also tachycardic with heart rate in the 110s on arrival.  COVID-19 and influenza panel was negative.  Echocardiogram obtained on 01/05/2021 showed EF greater than 75%, small to moderate pericardial effusion located near apex of LV/RV, no evidence of pericardial effusion, mild asymmetric LVH of basal septal segment, hyperdynamic RV with RVSP 40.7 mmHg, no significant valve issue.  Cardiology service  has been consulted for pericardial effusion.  Oncology service and palliative care service has been consulted.   Past Medical History:  Diagnosis Date   Ovarian cancer [facility])     Past Surgical History:  Procedure Laterality Date   CESAREAN SECTION     1988   COLON RESECTION     underwent 2 resection of tumor of colon by Dr. Antonietta Barcelona in [place]     2015   miscarriage     1989   MYOMECTOMY     for fibroid removal 1998   right lumpectomy     1998     Home Medications:  Prior to Admission medications   Medication Sig Start Date End Date Taking? Authorizing Provider  amoxicillin-clavulanate (AUGMENTIN) 875-125 MG tablet Take 1 tablet by mouth 2 (two) times daily. Take for 7 days. Started 7/24   Yes [provider]  budesonide-formoterol (SYMBICORT) 80-4.5 MCG/ACT inhaler Inhale 2 puffs into the lungs 2 (two) times daily. For 30 days.   Yes [provider]  diazepam (VALIUM) 5 MG tablet Take 5 mg by mouth every 6 (six) hours as needed for anxiety.   Yes [provider]  diphenhydrAMINE (BENADRYL) 25 MG tablet Take 25 mg by mouth every 6 (six) hours as needed for itching.   Yes [provider]  docusate sodium (COLACE) 100 MG capsule Take 100 mg by mouth daily as needed for mild constipation.   Yes [provider]  escitalopram (LEXAPRO) 10 MG tablet Take 10 mg by mouth daily.   Yes [provider]  famotidine (PEPCID) 20 MG tablet Take 20 mg by mouth daily.   Yes [provider]  fentaNYL (DURAGESIC) 25 MCG/HR Place 1 patch onto the skin every 3 (three) days.   Yes [provider]  guaifenesin (ROBITUSSIN) 100 MG/5ML syrup Take 200 mg by mouth 4 (four) times daily as needed for cough.   Yes [provider]  hydrocerin (EUCERIN) CREA Apply 1 application topically daily as needed (dry skin).   Yes [provider]  ipratropium-albuterol (DUONEB) 0.5-2.5 (3) MG/3ML SOLN  Take 3 mLs by nebulization every 6 (six) hours as needed (bronchospasms).   Yes [provider]  lidocaine-prilocaine (EMLA) cream Apply 1 application topically as needed (pain up to 20 doses). Apply to mediport one hour prior to chemotherapy cover with saran wrap.   Yes [provider]  methocarbamol (ROBAXIN) 500 MG tablet Take 500 mg by mouth at bedtime.   Yes [provider]  naloxone (NARCAN) nasal spray 4 mg/0.1 mL Place 1 spray into the nose daily as needed (narcotic overdose).   Yes [provider]  ondansetron (ZOFRAN) 4 MG tablet Take 4 mg by mouth every 8 (eight) hours as needed for nausea or vomiting.   Yes [provider]  oxyCODONE-acetaminophen (PERCOCET) 10-325 MG tablet Take 1 tablet by mouth every 6 (six) hours as needed for pain.   Yes [provider]  Polyethyl Glycol-Propyl Glycol (SYSTANE) 0.4-0.3 % SOLN Apply 1 drop to eye daily. Both eyes.   Yes  [provider]  sucralfate (CARAFATE) 1 g tablet Take 1 g by mouth daily as needed (acid reflux).   Yes [provider]  SUMAtriptan (IMITREX) 50 MG tablet Take 50 mg by mouth every 2 (two) hours as needed for migraine. May repeat in 2 hours if headache persists or recurs.   Yes [provider]    Inpatient Medications: Scheduled Meds:  Chlorhexidine Gluconate Cloth  6 each Topical Daily   enoxaparin (LOVENOX) injection  40 mg Subcutaneous Q24H   Continuous Infusions:  ampicillin-sulbactam (UNASYN) IV 3 g (01/14/21 0906)   PRN Meds: acetaminophen, HYDROmorphone (DILAUDID) injection, LORazepam, melatonin, metoprolol tartrate, naLOXone (NARCAN)  injection, ondansetron (ZOFRAN) IV, oxyCODONE, polyethylene glycol, sodium chloride flush  Allergies:    Allergies  Allergen Reactions   Vancomycin Itching   Flagyl [Metronidazole]     Pt states it made her "feel weird and nauseous"   Sulfasalazine     Severe burning sensation in the esophagus.   Tape      Burning sensation of the skin if left on too long    Social History:   Social History   Socioeconomic History   Marital status: Single    Spouse name: Not on file   Number of children: Not on file   Years of education: Not on file   Highest education level: Not on file  Occupational History   Not on file  Tobacco Use   Smoking status: Never   Smokeless tobacco: Never  Substance and Sexual Activity   Alcohol use: Never   Drug use: Never   Sexual activity: Not on file  Other Topics Concern   Not on file  Social History Narrative   Not on file   Social Determinants of Health   Financial Resource Strain: Not on file  Food Insecurity: Not on file  Transportation Needs: Not on file  Physical Activity: Not on file  Stress: Not on file  Social Connections: Not on file  Intimate Partner Violence: Not on file    Family History:   Family History  Problem Relation Age of Onset   Cancer Sister    Breast cancer Sister    Stroke Maternal Grandmother    Heart attack Paternal Grandfather      ROS:  Please see the history of present illness.   All other ROS reviewed and negative.     Physical Exam/Data:   Vitals:   01/14/21 0000 01/14/21 0430 01/14/21 0732 01/14/21 1205  BP: (!) 137/95 (!) 144/89 (!) 141/94 (!) 146/94  Pulse: (!) 101 (!) 105 (!) 121 (!) 119  Resp: '17 16 15 15  '$ Temp: 98.2 F (36.8 C) 98.4 F (36.9 C) (!) 97.5 F (36.4 C) 97.8 F (36.6 C)  TempSrc: Oral Oral Oral Oral  SpO2: 98% 97% 97%   Weight:      Height:        Intake/Output Summary (Last 24 hours) at 01/14/2021 1447 Last data filed at 01/14/2021 [id number] Gross per 24 hour  Intake 120 ml  Output --  Net 120 ml   Last 3 Weights 01/13/2021  Weight (lbs) 140 lb  Weight (kg) 63.504 kg     Body mass index is 24.8 kg/m.  General:  Well nourished, well developed, in no acute distress HEENT: normal Lymph: no adenopathy Neck: no JVD Endocrine:  No thryomegaly Vascular: No carotid bruits; FA  pulses 2+ bilaterally without bruits  Cardiac:  normal S1, S2; RRR; no murmur  Lungs:  clear to auscultation  bilaterally, no wheezing, rhonchi or rales  Abd: soft, nontender, no hepatomegaly  Ext: no edema Musculoskeletal:  No deformities, BUE and BLE strength normal and equal Skin: warm and dry  Neuro:  CNs 2-12 intact, no focal abnormalities noted Psych:  Normal affect   EKG:  The EKG was personally reviewed and demonstrates: Sinus tachycardia, no significant ST-T wave changes Telemetry:  Telemetry was personally reviewed and demonstrates: Not currently on telemetry, previous telemetry revealed a sinus tachycardia with heart rate in the 110s  Relevant CV Studies:  Echo 01/13/2021 1. There is a small to moderate pericardial effusion (up to 0.9 cm)  located near the apex of the LV/RV. There is no RV/RA collapse in  diastole. The IVC is collapsing. There is no evidence of pericardial  tamponade. Moderate pericardial effusion. The  pericardial effusion is surrounding the apex. There is no evidence of  cardiac tamponade.   2. Left ventricular ejection fraction, by estimation, is >75%. The left  ventricle has hyperdynamic function. The left ventricle has no regional  wall motion abnormalities. There is mild asymmetric left ventricular  hypertrophy of the basal-septal segment.   Indeterminate diastolic filling due to E-A fusion.   3. Right ventricular systolic function is hyperdynamic. The right  ventricular size is normal. There is mildly elevated pulmonary artery  systolic pressure. The estimated right ventricular systolic pressure is  [id number] mmHg.   4. The mitral valve is grossly normal. No evidence of mitral valve  regurgitation. No evidence of mitral stenosis.   5. The aortic valve is tricuspid. Aortic valve regurgitation is not  visualized. No aortic stenosis is present.   6. The inferior vena cava is normal in size with greater than 50%  respiratory variability, suggesting right  atrial pressure of 3 mmHg.   Comparison(s): No prior Echocardiogram.    Laboratory Data:  High Sensitivity Troponin:   Recent Labs  Lab 01/12/21 2207 01/13/21 0527  TROPONINIHS 31* 18*     Chemistry Recent Labs  Lab 01/12/21 2207 01/13/21 0344 01/14/21 0546  NA 135 135 136  K 3.9 3.8 3.7  CL 99 102 99  CO2 '25 23 28  '$ GLUCOSE 97 101* 98  BUN 5* <5* 5*  CREATININE 0.60 0.60 0.58  CALCIUM 8.9 8.2* 8.7*  GFRNONAA >60 >60 >60  ANIONGAP '11 10 9    '$ Recent Labs  Lab 01/12/21 2207  PROT 6.9  ALBUMIN 2.6*  AST 25  ALT 18  ALKPHOS 74  BILITOT 0.5   Hematology Recent Labs  Lab 01/12/21 2207 01/13/21 0344 01/14/21 0546  WBC 7.5 7.7 5.5  RBC 5.04 4.49 4.40  HGB 14.2 12.3 12.1  HCT 45.4 40.5 39.6  MCV 90.1 90.2 90.0  MCH 28.2 27.4 27.5  MCHC 31.3 30.4 30.6  RDW 15.6* 15.6* 15.4  PLT 310 272 274   BNP Recent Labs  Lab 01/12/21 2207  BNP 73.4    DDimer No results for input(s): DDIMER in the last 168 hours.   Radiology/Studies:  DG Chest 2 View  Result Date: 01/12/2021 CLINICAL DATA:  History of stage IV ovarian cancer presenting with shortness of breath. EXAM: CHEST - 2 VIEW COMPARISON:  None. FINDINGS: A right-sided venous Port-A-Cath is seen with its distal tip noted within the right atrium. This is approximately 2.5 cm distal to the junction of the superior vena cava and right atrium. Innumerable ill-defined multifocal opacities are seen scattered throughout both lungs. Mild atelectasis and/or infiltrate is also seen within the lateral aspect of the  right lung base. A very small right pleural effusion is seen. No pneumothorax is identified. The heart size and mediastinal contours are within normal limits. The visualized skeletal structures are unremarkable. IMPRESSION: 1. Findings which may represent marked severity bilateral multifocal infiltrates. Given the patient's history of stage IV ovarian cancer, sequelae associated with diffuse pulmonary metastasis  cannot be excluded. 2. Mild right basilar atelectasis and/or infiltrate with a very small right pleural effusion. Electronically Signed   By: Virgina Norfolk M.D.   On: 01/12/2021 22:41   DG Swallowing Func-Speech Pathology  Result Date: 01/14/2021 Formatting of this result is different from the original. Objective Swallowing Evaluation: Type of Study: MBS-Modified Barium Swallow Study  Patient Details Name: DIASIA SIVERTSEN MRN: [id number] Date of Birth: 12/05/1960 Today's Date: 01/14/2021 Time: SLP Start Time (ACUTE ONLY): 1305 -SLP Stop Time (ACUTE ONLY): 1323 SLP Time Calculation (min) (ACUTE ONLY): 18 min Past Medical History: Past Medical History: Diagnosis Date  Ovarian cancer ([facility])  Past Surgical History: No past surgical history on file. HPI: Pt is a 60 y.o. female who presented to the ED with shortness of breath, and chest and back pain. CXR 7/27: marked severity bilateral multifocal infiltrates. Given the patient's history of stage IV ovarian cancer, sequelae associated with diffuse pulmonary metastasis cannot be  excluded. Mild right basilar atelectasis and/or infiltrate with a very small right pleural effusion. Palliative care has been consulted to help establish [place] since pt was receiving hospice care in [place]. PMH: ovarian cancer with diffuse metastasis previously under hospice care and recently admitted at outside facility in [place], but moved to [place] to be closer to family.  No data recorded Assessment / Plan / Recommendation CHL IP CLINICAL IMPRESSIONS 01/14/2021 Clinical Impression Pt was seen in radiology suite for modified barium swallow study. Trials of puree solids, regular texture solids, a 9m barium tablet, and thin liquids via cup and straw were administered. Pt's oropharyngeal swallow mechanism was within functional limits. Oral phase was mildly prolonged, but pt stated that she practices mindful eating and therefore enjoys taking extra time and care to consider the textures  and/or various flavors of foods while eating. Secondary swallows were due to mild oral residue, and pt's desire to completely clear the oral cavity but [facility] No instances of penetration or aspiration were demonstrated even when her swallow was challenged. It is recommended that a regular texture diet with thin liquids be continued at this time. Further skilled SLP services are not clinically indicated at this time. SLP Visit Diagnosis Dysphagia, unspecified (R13.10) Attention and concentration deficit following -- Frontal lobe and executive function deficit following -- Impact on safety and function --   CHL IP TREATMENT RECOMMENDATION 01/14/2021 Treatment Recommendations No treatment recommended at this time   No flowsheet data found. CHL IP DIET RECOMMENDATION 01/14/2021 SLP Diet Recommendations Regular solids;Thin liquid Liquid Administration via Cup;Straw Medication Administration Whole meds with liquid Compensations Slow rate;Small sips/bites Postural Changes Seated upright at 90 degrees   CHL IP OTHER RECOMMENDATIONS 01/14/2021 Recommended Consults -- Oral Care Recommendations Oral care BID Other Recommendations --   CHL IP FOLLOW UP RECOMMENDATIONS 01/14/2021 Follow up Recommendations None   CHL IP FREQUENCY AND DURATION 01/14/2021 Speech Therapy Frequency (ACUTE ONLY) min 2x/week Treatment Duration 2 weeks      CHL IP ORAL PHASE 01/14/2021 Oral Phase WFL Oral - Pudding Teaspoon -- Oral - Pudding Cup -- Oral - Honey Teaspoon -- Oral - Honey Cup -- Oral - Nectar Teaspoon -- Oral -  Nectar Cup -- Oral - Nectar Straw -- Oral - Thin Teaspoon -- Oral - Thin Cup -- Oral - Thin Straw -- Oral - Puree -- Oral - Mech Soft -- Oral - Regular -- Oral - Multi-Consistency -- Oral - Pill -- Oral Phase - Comment --  CHL IP PHARYNGEAL PHASE 01/14/2021 Pharyngeal Phase WFL Pharyngeal- Pudding Teaspoon -- Pharyngeal -- Pharyngeal- Pudding Cup -- Pharyngeal -- Pharyngeal- Honey Teaspoon -- Pharyngeal -- Pharyngeal- Honey Cup --  Pharyngeal -- Pharyngeal- Nectar Teaspoon -- Pharyngeal -- Pharyngeal- Nectar Cup -- Pharyngeal -- Pharyngeal- Nectar Straw -- Pharyngeal -- Pharyngeal- Thin Teaspoon -- Pharyngeal -- Pharyngeal- Thin Cup -- Pharyngeal -- Pharyngeal- Thin Straw -- Pharyngeal -- Pharyngeal- Puree -- Pharyngeal -- Pharyngeal- Mechanical Soft -- Pharyngeal -- Pharyngeal- Regular -- Pharyngeal -- Pharyngeal- Multi-consistency -- Pharyngeal -- Pharyngeal- Pill -- Pharyngeal -- Pharyngeal Comment --  CHL IP CERVICAL ESOPHAGEAL PHASE 01/14/2021 Cervical Esophageal Phase WFL Pudding Teaspoon -- Pudding Cup -- Honey Teaspoon -- Honey Cup -- Nectar Teaspoon -- Nectar Cup -- Nectar Straw -- Thin Teaspoon -- Thin Cup -- Thin Straw -- Puree -- Mechanical Soft -- Regular -- Multi-consistency -- Pill -- Cervical Esophageal Comment -- Shanika I. Hardin Negus, [place], [place] Office number [phone] Pager [phone] Horton Marshall 01/14/2021, 1:56 PM              ECHOCARDIOGRAM COMPLETE  Result Date: 01/13/2021    ECHOCARDIOGRAM REPORT   Patient Name:   ANTONIYA VORNDRAN Date of Exam: 01/13/2021 Medical Rec #:  [id number]         Height:       63.0 in Accession #:    [id number]        Weight:       140.0 lb Date of Birth:  09-14-1960          BSA:          1.662 m Patient Age:    23 years          BP:           129/109 mmHg Patient Gender: F                 HR:           120 bpm. Exam Location:  Inpatient Procedure: 2D Echo, Cardiac Doppler and Color Doppler Indications:     Elevated troponin.; R07.9* Chest pain, unspecified; R06.02 SOB  History:         Patient has no prior history of Echocardiogram examinations.                  Signs/Symptoms:Chest Pain, Dyspnea and Shortness of Breath.                  Cancer. Pneumonia.  Sonographer:     Roseanna Rainbow RDCS Referring Phys:  [id number] [facility] Diagnosing Phys: Eleonore Chiquito MD  Sonographer Comments: Technically difficult study due to poor echo windows. IMPRESSIONS  1.  There is a small to moderate pericardial effusion (up to 0.9 cm) located near the apex of the LV/RV. There is no RV/RA collapse in diastole. The IVC is collapsing. There is no evidence of pericardial tamponade. Moderate pericardial effusion. The pericardial effusion is surrounding the apex. There is no evidence of cardiac tamponade.  2. Left ventricular ejection fraction, by estimation, is >75%. The left ventricle has hyperdynamic function. The left ventricle has no regional wall motion abnormalities. There is mild asymmetric left ventricular hypertrophy of  the basal-septal segment.  Indeterminate diastolic filling due to E-A fusion.  3. Right ventricular systolic function is hyperdynamic. The right ventricular size is normal. There is mildly elevated pulmonary artery systolic pressure. The estimated right ventricular systolic pressure is [id number] mmHg.  4. The mitral valve is grossly normal. No evidence of mitral valve regurgitation. No evidence of mitral stenosis.  5. The aortic valve is tricuspid. Aortic valve regurgitation is not visualized. No aortic stenosis is present.  6. The inferior vena cava is normal in size with greater than 50% respiratory variability, suggesting right atrial pressure of 3 mmHg. Comparison(s): No prior Echocardiogram. FINDINGS  Left Ventricle: Left ventricular ejection fraction, by estimation, is >75%. The left ventricle has hyperdynamic function. The left ventricle has no regional wall motion abnormalities. The left ventricular internal cavity size was normal in size. There is mild asymmetric left ventricular hypertrophy of the basal-septal segment. Indeterminate diastolic filling due to E-A fusion. Right Ventricle: The right ventricular size is normal. No increase in right ventricular wall thickness. Right ventricular systolic function is hyperdynamic. There is mildly elevated pulmonary artery systolic pressure. The tricuspid regurgitant velocity is 3.07 m/s, and with an assumed right  atrial pressure of 3 mmHg, the estimated right ventricular systolic pressure is [id number] mmHg. Left Atrium: Left atrial size was normal in size. Right Atrium: Right atrial size was normal in size. Pericardium: There is a small to moderate pericardial effusion (up to 0.9 cm) located near the apex of the LV/RV. There is no RV/RA collapse in diastole. The IVC is collapsing. There is no evidence of pericardial tamponade. A moderately sized pericardial  effusion is present. The pericardial effusion is surrounding the apex. There is no evidence of cardiac tamponade. Mitral Valve: The mitral valve is grossly normal. No evidence of mitral valve regurgitation. No evidence of mitral valve stenosis. Tricuspid Valve: The tricuspid valve is grossly normal. Tricuspid valve regurgitation is trivial. No evidence of tricuspid stenosis. Aortic Valve: The aortic valve is tricuspid. Aortic valve regurgitation is not visualized. No aortic stenosis is present. Pulmonic Valve: The pulmonic valve was grossly normal. Pulmonic valve regurgitation is not visualized. No evidence of pulmonic stenosis. Aorta: The aortic root and ascending aorta are structurally normal, with no evidence of dilitation. Venous: The inferior vena cava is normal in size with greater than 50% respiratory variability, suggesting right atrial pressure of 3 mmHg. IAS/Shunts: The atrial septum is grossly normal. Additional Comments: There is a small pleural effusion in the left lateral region.  LEFT VENTRICLE PLAX 2D LVIDd:         3.10 cm     Diastology LVIDs:         2.00 cm     LV e' medial:    6.09 cm/s LV PW:         0.90 cm     LV E/e' medial:  8.5 LV IVS:        1.20 cm     LV e' lateral:   10.70 cm/s LVOT diam:     1.90 cm     LV E/e' lateral: 4.8 LV SV:         36 LV SV Index:   22 LVOT Area:     2.84 cm  LV Volumes (MOD) LV vol d, MOD A2C: 32.3 ml LV vol d, MOD A4C: 27.3 ml LV vol s, MOD A2C: 9.0 ml LV vol s, MOD A4C: 12.0 ml LV SV MOD A2C:     23.3 ml LV SV MOD  A4C:     27.3 ml LV SV MOD BP:      24.9 ml RIGHT VENTRICLE            IVC RV S prime:     9.66 cm/s  IVC diam: 1.90 cm TAPSE (M-mode): 2.5 cm LEFT ATRIUM             Index      RIGHT ATRIUM          Index LA diam:        3.20 cm 1.93 cm/m RA Area:     8.20 cm LA Vol (A2C):   9.0 ml  5.39 ml/m RA Volume:   15.60 ml 9.39 ml/m LA Vol (A4C):   8.7 ml  5.25 ml/m LA Biplane Vol: 8.8 ml  5.31 ml/m  AORTIC VALVE LVOT Vmax:   97.40 cm/s LVOT Vmean:  61.800 cm/s LVOT VTI:    0.128 m  AORTA Ao Root diam: 3.20 cm Ao Asc diam:  3.30 cm MITRAL VALVE               TRICUSPID VALVE MV Area (PHT): 6.71 cm    TR Peak grad:   37.7 mmHg MV Decel Time: 113 msec    TR Vmax:        307.00 cm/s MV E velocity: 51.80 cm/s MV A velocity: 80.60 cm/s  SHUNTS MV E/A ratio:  0.64        Systemic VTI:  0.13 m                            Systemic Diam: 1.90 cm Eleonore Chiquito MD Electronically signed by Eleonore Chiquito MD Signature Date/Time: 01/13/2021/1:25:04 PM    Final (Updated)      Assessment and Plan:   Moderate pericardial effusion  -Echocardiogram obtained on 01/13/2021 showed greater than 75% EF, small to moderate pericardial effusion located near apex of LV and RV, no LV or RV collapse during diastole, no evidence of pericardial tamponade.  RV systolic function was hyperdynamic with RVSP 40.7 mmHg.  -Blood pressure normal.  Patient is tachycardic in the setting of multifocal pneumonia.  There is no sign of hemodynamic compromise.  Echocardiogram did not show any evidence of tamponade.  -This does not need to be treated.  Unclear if pericardial effusion is related to her cancer or multifocal pneumonia.  She appears to be euvolemic on exam.  EF is hyperdynamic likely related to underlying condition.  Will discuss with MD, does not require any further inpatient work-up.  Likely repeat echocardiogram in 3 to 4 weeks as outpatient to make sure pericardial effusion does not enlarge.  Multifocal pneumonia: Treatment per primary team.   Procalcinotin surprisingly negative. She does not appears to be volume overloaded. Continue IV antibiotic with Unasyn  Metastatic ovarian cancer: Oncology service has been consulted to establish with local oncology service.  Palliative care service also consulted    Risk Assessment/Risk Scores:             For questions or updates, please contact [facility] Please consult [email or web address] for contact info under    Hilbert Corrigan, [place]  01/14/2021 2:47 PM

## 2021-01-14 NOTE — Progress Notes (Signed)
   01/14/21 1405  Assess: MEWS Score  Temp 98.7 F (37.1 C)  BP 127/76  Pulse Rate (!) 120  ECG Heart Rate (!) 120  Resp (!) 23  Level of Consciousness Alert  SpO2 91 %  O2 Device Nasal Cannula  O2 Flow Rate (L/min) 3 L/min  Assess: MEWS Score  MEWS Temp 0  MEWS Systolic 0  MEWS Pulse 2  MEWS RR 1  MEWS LOC 0  MEWS Score 3  MEWS Score Color Yellow  Assess: if the MEWS score is Yellow or Red  Were vital signs taken at a resting state? Yes  Focused Assessment No change from prior assessment  Early Detection of Sepsis Score *See Row Information* Low  MEWS guidelines implemented *See Row Information* Yes  Treat  MEWS Interventions Escalated (See documentation below)  Pain Scale 0-10  Pain Score 2  Take Vital Signs  Increase Vital Sign Frequency  Red: Q 1hr X 4 then Q 4hr X 4, if remains red, continue Q 4hrs  Escalate  MEWS: Escalate Red: discuss with charge nurse/RN and provider, consider discussing with RRT  Notify: Charge Nurse/RN  Name of Charge Nurse/RN Notified Christie Viscomi Griffiith  Date Charge Nurse/RN Notified 01/14/21  Time Charge Nurse/RN Notified 1400  Notify: Provider  Provider Name/Title [email or web address]  Date Provider Notified 01/14/21  Time Provider Notified 1412  Notification Type Page  Notification Reason Other (Comment) (red mews, pt seems to be ok, no change from [place].  heart rate increases when walking and  moving around in the room)  Provider response No new orders  Date of Provider Response 01/14/21  Time of Provider Response 1420  Document  Progress note created (see row info) Yes

## 2021-01-14 NOTE — Progress Notes (Signed)
Outside notes from previous hospitalization now available in [place] and reviewed-   This pertinent information noted from note by Dr. Mart Piggs- [facility]- on 01/06/2021:   "Ultimately, I stressed with the patient that I think time is growing shorter for her and that in fact there is a chance that if she gets any complications at all, she may not survive this hospitalization. I told her we need to focus on more immediate goals rather than long-term goals as there may not be time for "long-term." I discussed with her that if she wants to get to [place] to be with her parents, this needs to happen urgently. I described her chest CT scan to her, telling her that there are too many nodules to accurately count. I told her that her lungs are worsening with likely malignant effusions and malignant nodules. I told her that as best I know chemotherapy is not in the cards for her and that these tumors will continue to grow.  I again discussed today goals of care. The patient does not fact want to get to [facility] I told her that its not a guarantee that we can do that and that personally I would favor her getting there sooner rather than later. The longer she waits the less likely it is to occur that she is able to travel to [place]. I discussed with her that in my opinion her best option is to be discharged and go virtually straight to the airport. She already has an open airline ticket that she can use.  I spoke to the social work team/palliative care team about options and my concerns. Again, the longer we wait to get her to [facility], the less likely it occur.  Today had a long discussion with the patient about advanced directives. She tells me that she has filled a living will at home. She says that that indicates that she wants no extraordinary measures. I outlined that I would therefore put in a full DNR order as I consider any portion of life support  extraordinary measures given her clinical situation. She was in agreement. It was clearly difficult for her to make this decision and accept this recommendation. Her ability to accept a DNR order has come over several different conversations. Ultimately, however, I stressed to her yet again that she has virtually no ability to survive CPR and that I think she deserves to be comfortable at the end of her life."   PMT will continue this discussion with patient and her mother over the weekend.   Mariana Kaufman, AGNP-C Palliative Medicine  No charge note

## 2021-01-14 NOTE — Evaluation (Addendum)
Clinical/Bedside Swallow Evaluation Patient Details  Name: Chelsea Mcintosh MRN: [id number] Date of Birth: 02-Mar-1961  Today's Date: 01/14/2021 Time: SLP Start Time (ACUTE ONLY): 0847 SLP Stop Time (ACUTE ONLY): 0911 SLP Time Calculation (min) (ACUTE ONLY): 24 min  Past Medical History:  Past Medical History:  Diagnosis Date   Ovarian cancer [facility])    Past Surgical History: History reviewed. No pertinent surgical history. HPI:  Pt is a 60 y.o. female who presented to the ED with shortness of breath, and chest and back pain. CXR 7/27: marked severity bilateral multifocal infiltrates. Given the patient's history of stage IV ovarian cancer, sequelae associated with diffuse pulmonary metastasis cannot be  excluded. Mild right basilar atelectasis and/or infiltrate with a very small right pleural effusion. Palliative care has been consulted to help establish [place] since pt was receiving hospice care in [place]. PMH: ovarian cancer with diffuse metastasis previously under hospice care and recently admitted at outside facility in [place], but moved to [place] to be closer to family.   Assessment / Plan / Recommendation Clinical Impression  Pt was seen for bedside swallow evaluation and she reported that she occasionally demonstrates coughing with p.o. intake and consumes softer solids due to foods "getting stuck" when she swallows. Oral mechanism exam was [facility] and she presented with adequate, natural dentition.  She demonstrated multiple swallows (up to five) with all solids, but her swallow otherwise appeared [facility]. Considering pt's reports and presentation, a modified barium swallow study will be conducted to further assess physiology. Diet recommendations will be deferred until the study is completed. SLP Visit Diagnosis: Dysphagia, unspecified (R13.10)    Aspiration Risk  Mild aspiration risk    Diet Recommendation Regular;Thin liquid (continue current diet until MBS is completed)  Liquid  Administration via: Cup;Straw Medication Administration: Whole meds with liquid Supervision: Patient able to self feed Compensations: Slow rate;Small sips/bites Postural Changes: Seated upright at 90 degrees    Other  Recommendations     Follow up Recommendations  (TBD)      Frequency and Duration min 2x/week  2 weeks       Prognosis        Swallow Study   General Date of Onset: 01/13/21 HPI: Pt is a 60 y.o. female who presented to the ED with shortness of breath, and chest and back pain. CXR 7/27: marked severity bilateral multifocal infiltrates. Given the patient's history of stage IV ovarian cancer, sequelae associated with diffuse pulmonary metastasis cannot be  excluded. Mild right basilar atelectasis and/or infiltrate with a very small right pleural effusion. Palliative care has been consulted to help establish [place] since pt was receiving hospice care in [place]. PMH: ovarian cancer with diffuse metastasis previously under hospice care and recently admitted at outside facility in [place], but moved to [place] to be closer to family. Type of Study: Bedside Swallow Evaluation Previous Swallow Assessment: none Diet Prior to this Study: Regular;Thin liquids Temperature Spikes Noted: No Respiratory Status: Nasal cannula History of Recent Intubation: No Behavior/Cognition: Alert;Cooperative;Pleasant mood Oral Cavity Assessment: Within Functional Limits Oral Care Completed by SLP: No Vision: Functional for self-feeding Self-Feeding Abilities: Able to feed self Patient Positioning: Upright in bed;Postural control adequate for testing Baseline Vocal Quality: Low vocal intensity Volitional Cough: Weak Volitional Swallow: Able to elicit    Oral/Motor/Sensory Function Overall Oral Motor/Sensory Function: Within functional limits   Ice Chips Ice chips: Within functional limits Presentation: Spoon   Thin Liquid Thin Liquid: Within functional limits Presentation: Straw     Nectar  Thick Nectar Thick Liquid: Not tested   Honey Thick Honey Thick Liquid: Not tested   Puree Puree: Impaired Presentation: Spoon Pharyngeal Phase Impairments: Multiple swallows   Solid     Solid: Impaired Presentation: Self Fed Pharyngeal Phase Impairments: Multiple swallows     Alexiss Iturralde I. Hardin Negus, [place], [place] Office number [phone] Pager [phone]  Horton Marshall 01/14/2021,9:37 AM

## 2021-01-14 NOTE — Progress Notes (Signed)
Modified Barium Swallow Progress Note  Patient Details  Name: Chelsea Mcintosh MRN: [id number] Date of Birth: Dec 30, 1960  Today's Date: 01/14/2021  Modified Barium Swallow completed.  Full report located under Chart Review in the Imaging Section.  Brief recommendations include the following:  Clinical Impression  Pt was seen in radiology suite for modified barium swallow study. Trials of puree solids, regular texture solids, a 85m barium tablet, and thin liquids via cup and straw were administered. Pt's oropharyngeal swallow mechanism was within functional limits. Oral phase was mildly prolonged, but pt stated that she practices mindful eating and therefore enjoys taking extra time and care to consider the textures and/or various flavors of foods while eating. Secondary swallows were due to mild oral residue, and pt's desire to completely clear the oral cavity but [facility] No instances of penetration or aspiration were demonstrated even when her swallow was challenged. It is recommended that a regular texture diet with thin liquids be continued at this time. Further skilled SLP services are not clinically indicated at this time.   Swallow Evaluation Recommendations       SLP Diet Recommendations: Regular solids;Thin liquid   Liquid Administration via: Cup;Straw   Medication Administration: Whole meds with liquid   Supervision: Patient able to self feed       Postural Changes: Seated upright at 90 degrees   Oral Care Recommendations: Oral care BID      Fumio Vandam I. PHardin Negus MEscondida CQuail CreekOffice number [phone] [phone] SHorton Marshall7/29/2022,1:56 PM

## 2021-01-14 NOTE — Plan of Care (Signed)
  Problem: Education: Goal: Knowledge of General Education information will improve Description Including pain rating scale, medication(s)/side effects and non-pharmacologic comfort measures Outcome: Progressing   

## 2021-01-14 NOTE — Progress Notes (Signed)
Patient ID: Chelsea Mcintosh, female   DOB: May 14, 1961, 60 y.o.   MRN: [id number]  PROGRESS NOTE    Chelsea Mcintosh  [id number] DOB: [birth year] DOA: 01/12/2021 PCP: System, Provider Not In   Brief Narrative:   60 y.o. female with medical history significant for ovarian cancer with diffuse metastasis who was supposed to start hospice care in [place] and recently admitted at outside facility in [place] for possible pneumonia with subsequent discharge on oral Augmentin presented with worsening shortness of breath, chest and back pain.  She recently moved to [place].  On presentation, she was hypoxic with oxygen saturations in the 70s on room air.  She was started on supplemental oxygen.  Chest x-ray showed bilateral pulmonary infiltrates.  She was started on antibiotics.  Assessment & Plan:   Multifocal pneumonia Acute respiratory failure with hypoxia -Patient was recently admitted at an outside facility in [place] for possible pneumonia and treated with antibiotics and discharged on Augmentin -Presented with worsening shortness of breath and chest x-ray showed multifocal pneumonia.  Continue Unasyn.  Procalcitonin not remarkable.  Cultures negative so far. -COVID and influenza test were negative on presentation. -Still requiring 4 L oxygen via nasal cannula. -Unclear if this is bacterial pneumonia versus infiltrates from ovarian metastatic cancer.  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  She is hesitant to make further decisions and currently is looking for aggressive care.  Spoke in detail with the patient and try to convince her to think about hospice and DNR status. -She wants more information from oncology.  I have consulted oncology: Follow oncology recommendations.  Palliative care consultation is pending.  Elevated troponin -Most likely  from demand ischemia from above.  Initial troponin 31.  Repeat troponin 18.  Echo showed EF of >75% with moderate pericardial effusion.  Consult cardiology    DVT prophylaxis: Subcutaneous Lovenox Code Status: Full Family Communication: None at bedside Disposition Plan: Status is: Inpatient  Remains inpatient appropriate because:Inpatient level of care appropriate due to severity of illness  Dispo:  Patient From: Home  Planned Disposition: Home  Medically stable for discharge: No    Consultants: Oncology/palliative care.  Consult cardiology  Procedures: Echo as below  Antimicrobials: Unasyn   Subjective: Patient seen and examined at bedside.  Does not feel well.  Still struggling to breathe.  No overnight fever or vomiting reported.  Poor historian.  Objective: Vitals:   01/13/21 2000 01/14/21 0000 01/14/21 0430 01/14/21 0732  BP: 128/79 (!) 137/95 (!) 144/89 (!) 141/94  Pulse: 100 (!) 101 (!) 105 (!) 121  Resp: '17 17 16 15  '$ Temp: 98.3 F (36.8 C) 98.2 F (36.8 C) 98.4 F (36.9 C) (!) 97.5 F (36.4 C)  TempSrc: Oral Oral Oral Oral  SpO2: 99% 98% 97% 97%  Weight:      Height:        Intake/Output Summary (Last 24 hours) at 01/14/2021 1032 Last data filed at 01/14/2021 0212 Gross per 24 hour  Intake 220 ml  Output --  Net 220 ml   Filed Weights   01/13/21 0334  Weight: 63.5 kg    Examination:  General exam: Currently on 4 L via nasal cannula.  Looks chronically ill and deconditioned.   Respiratory system: Bilateral decreased breath sounds at bases with scattered crackles Cardiovascular system: S1 & S2 heard, tachycardic Gastrointestinal system: Abdomen is distended, soft  and nontender.  Ostomy bag present.  Normal bowel sounds heard. Extremities: No cyanosis, clubbing; trace lower extremity edema Central nervous system: Awake, extremely slow to respond.  Poor historian. No focal neurological deficits. Moving extremities Skin: No rashes, lesions or  ulcers Psychiatry: Flat affect.  Intermittently gets anxious.   Data Reviewed: I have personally reviewed following labs and imaging studies  CBC: Recent Labs  Lab 01/12/21 2207 01/13/21 0344 01/14/21 0546  WBC 7.5 7.7 5.5  NEUTROABS 6.1  --   --   HGB 14.2 12.3 12.1  HCT 45.4 40.5 39.6  MCV 90.1 90.2 90.0  PLT 310 272 [id number]   Basic Metabolic Panel: Recent Labs  Lab 01/12/21 2207 01/13/21 0344 01/14/21 0546  NA 135 135 136  K 3.9 3.8 3.7  CL 99 102 99  CO2 '25 23 28  '$ GLUCOSE 97 101* 98  BUN 5* <5* 5*  CREATININE 0.60 0.60 0.58  CALCIUM 8.9 8.2* 8.7*   GFR: Estimated Creatinine Clearance: 65.4 mL/min (by C-G formula based on SCr of 0.58 mg/dL). Liver Function Tests: Recent Labs  Lab 01/12/21 2207  AST 25  ALT 18  ALKPHOS 74  BILITOT 0.5  PROT 6.9  ALBUMIN 2.6*   No results for input(s): LIPASE, AMYLASE in the last 168 hours. No results for input(s): AMMONIA in the last 168 hours. Coagulation Profile: Recent Labs  Lab 01/12/21 2207  INR 1.2   Cardiac Enzymes: No results for input(s): CKTOTAL, CKMB, CKMBINDEX, TROPONINI in the last 168 hours. BNP (last 3 results) No results for input(s): PROBNP in the last 8760 hours. HbA1C: No results for input(s): HGBA1C in the last 72 hours. CBG: No results for input(s): GLUCAP in the last 168 hours. Lipid Profile: No results for input(s): CHOL, HDL, LDLCALC, TRIG, CHOLHDL, LDLDIRECT in the last 72 hours. Thyroid Function Tests: No results for input(s): TSH, T4TOTAL, FREET4, T3FREE, THYROIDAB in the last 72 hours. Anemia Panel: No results for input(s): VITAMINB12, FOLATE, FERRITIN, TIBC, IRON, RETICCTPCT in the last 72 hours. Sepsis Labs: Recent Labs  Lab 01/12/21 2207 01/13/21 0344 01/14/21 0546  PROCALCITON  --  <0.10 <0.10  LATICACIDVEN 1.6  --   --     Recent Results (from the past 240 hour(s))  Blood culture (routine x 2)     Status: None (Preliminary result)   Collection Time: 01/12/21 10:07 PM    Specimen: BLOOD  Result Value Ref Range Status   Specimen Description BLOOD LEFT ANTECUBITAL  Final   Special Requests   Final    BOTTLES DRAWN AEROBIC AND ANAEROBIC Blood Culture adequate volume   Culture   Final    NO GROWTH < 12 HOURS Performed at [facility], [address] [place], [place] [address]    Report Status PENDING  Incomplete  Blood culture (routine x 2)     Status: None (Preliminary result)   Collection Time: 01/12/21 10:07 PM   Specimen: BLOOD RIGHT ARM  Result Value Ref Range Status   Specimen Description BLOOD RIGHT ARM  Final   Special Requests   Final    BOTTLES DRAWN AEROBIC ONLY Blood Culture adequate volume   Culture   Final    NO GROWTH < 12 HOURS Performed at [facility], [place] [address] [place], [place] [address]    Report Status PENDING  Incomplete  Resp Panel by RT-PCR (Flu A&B, Covid) Nasopharyngeal Swab     Status: None   Collection Time: 01/13/21 12:38 AM   Specimen:  Nasopharyngeal Swab; Nasopharyngeal(NP) swabs in vial transport medium  Result Value Ref Range Status   SARS Coronavirus 2 by RT PCR NEGATIVE NEGATIVE Final    Comment: (NOTE) SARS-CoV-2 target nucleic acids are NOT DETECTED.  The SARS-CoV-2 RNA is generally detectable in upper respiratory specimens during the acute phase of infection. The lowest concentration of SARS-CoV-2 viral copies this assay can detect is 138 copies/mL. A negative result does not preclude SARS-Cov-2 infection and should not be used as the sole basis for treatment or other patient management decisions. A negative result may occur with  improper specimen collection/handling, submission of specimen other than nasopharyngeal swab, presence of viral mutation(s) within the areas targeted by this assay, and inadequate number of viral copies(<138 copies/mL). A negative result must be combined with clinical observations, patient history, and epidemiological information. The expected result is  Negative.  Fact Sheet for Patients:  EntrepreneurPulse.com.au  Fact Sheet for Healthcare Providers:  IncredibleEmployment.be  This test is no t yet approved or cleared by the [place] FDA and  has been authorized for detection and/or diagnosis of SARS-CoV-2 by FDA under an Emergency Use Authorization (EUA). This EUA will remain  in effect (meaning this test can be used) for the duration of the COVID-19 declaration under Section 564(b)(1) of the Act, 21 U.S.C.section 360bbb-3(b)(1), unless the authorization is terminated  or revoked sooner.       Influenza A by PCR NEGATIVE NEGATIVE Final   Influenza B by PCR NEGATIVE NEGATIVE Final    Comment: (NOTE) The Xpert Xpress SARS-CoV-2/FLU/RSV plus assay is intended as an aid in the diagnosis of influenza from Nasopharyngeal swab specimens and should not be used as a sole basis for treatment. Nasal washings and aspirates are unacceptable for Xpert Xpress SARS-CoV-2/FLU/RSV testing.  Fact Sheet for Patients: EntrepreneurPulse.com.au  Fact Sheet for Healthcare Providers: IncredibleEmployment.be  This test is not yet approved or cleared by the [place] FDA and has been authorized for detection and/or diagnosis of SARS-CoV-2 by FDA under an Emergency Use Authorization (EUA). This EUA will remain in effect (meaning this test can be used) for the duration of the COVID-19 declaration under Section 564(b)(1) of the Act, 21 U.S.C. section 360bbb-3(b)(1), unless the authorization is terminated or revoked.  Performed at [facility], [place] [address], [place], [place] [address]          Radiology Studies: DG Chest 2 View  Result Date: 01/12/2021 CLINICAL DATA:  History of stage IV ovarian cancer presenting with shortness of breath. EXAM: CHEST - 2 VIEW COMPARISON:  None. FINDINGS: A right-sided venous Port-A-Cath is seen with its distal tip noted  within the right atrium. This is approximately 2.5 cm distal to the junction of the superior vena cava and right atrium. Innumerable ill-defined multifocal opacities are seen scattered throughout both lungs. Mild atelectasis and/or infiltrate is also seen within the lateral aspect of the right lung base. A very small right pleural effusion is seen. No pneumothorax is identified. The heart size and mediastinal contours are within normal limits. The visualized skeletal structures are unremarkable. IMPRESSION: 1. Findings which may represent marked severity bilateral multifocal infiltrates. Given the patient's history of stage IV ovarian cancer, sequelae associated with diffuse pulmonary metastasis cannot be excluded. 2. Mild right basilar atelectasis and/or infiltrate with a very small right pleural effusion. Electronically Signed   By: Virgina Norfolk M.D.   On: 01/12/2021 22:41   ECHOCARDIOGRAM COMPLETE  Result Date: 01/13/2021    ECHOCARDIOGRAM REPORT   Patient  Name:   NIMSY DROSS Date of Exam: 01/13/2021 Medical Rec #:  [id number]         Height:       63.0 in Accession #:    [id number]        Weight:       140.0 lb Date of Birth:  1961/06/18          BSA:          1.662 m Patient Age:    28 years          BP:           129/109 mmHg Patient Gender: F                 HR:           120 bpm. Exam Location:  Inpatient Procedure: 2D Echo, Cardiac Doppler and Color Doppler Indications:     Elevated troponin.; R07.9* Chest pain, unspecified; R06.02 SOB  History:         Patient has no prior history of Echocardiogram examinations.                  Signs/Symptoms:Chest Pain, Dyspnea and Shortness of Breath.                  Cancer. Pneumonia.  Sonographer:     Roseanna Rainbow RDCS Referring Phys:  [id number] [place] Diagnosing Phys: Eleonore Chiquito MD  Sonographer Comments: Technically difficult study due to poor echo windows. IMPRESSIONS  1. There is a small to moderate pericardial effusion (up to 0.9 cm) located near  the apex of the LV/RV. There is no RV/RA collapse in diastole. The IVC is collapsing. There is no evidence of pericardial tamponade. Moderate pericardial effusion. The pericardial effusion is surrounding the apex. There is no evidence of cardiac tamponade.  2. Left ventricular ejection fraction, by estimation, is >75%. The left ventricle has hyperdynamic function. The left ventricle has no regional wall motion abnormalities. There is mild asymmetric left ventricular hypertrophy of the basal-septal segment.  Indeterminate diastolic filling due to E-A fusion.  3. Right ventricular systolic function is hyperdynamic. The right ventricular size is normal. There is mildly elevated pulmonary artery systolic pressure. The estimated right ventricular systolic pressure is [id number] mmHg.  4. The mitral valve is grossly normal. No evidence of mitral valve regurgitation. No evidence of mitral stenosis.  5. The aortic valve is tricuspid. Aortic valve regurgitation is not visualized. No aortic stenosis is present.  6. The inferior vena cava is normal in size with greater than 50% respiratory variability, suggesting right atrial pressure of 3 mmHg. Comparison(s): No prior Echocardiogram. FINDINGS  Left Ventricle: Left ventricular ejection fraction, by estimation, is >75%. The left ventricle has hyperdynamic function. The left ventricle has no regional wall motion abnormalities. The left ventricular internal cavity size was normal in size. There is mild asymmetric left ventricular hypertrophy of the basal-septal segment. Indeterminate diastolic filling due to E-A fusion. Right Ventricle: The right ventricular size is normal. No increase in right ventricular wall thickness. Right ventricular systolic function is hyperdynamic. There is mildly elevated pulmonary artery systolic pressure. The tricuspid regurgitant velocity is 3.07 m/s, and with an assumed right atrial pressure of 3 mmHg, the estimated right ventricular systolic pressure is  [id number] mmHg. Left Atrium: Left atrial size was normal in size. Right Atrium: Right atrial size was normal in size. Pericardium: There is a small to moderate pericardial effusion (up to 0.9 cm) located near  the apex of the LV/RV. There is no RV/RA collapse in diastole. The IVC is collapsing. There is no evidence of pericardial tamponade. A moderately sized pericardial  effusion is present. The pericardial effusion is surrounding the apex. There is no evidence of cardiac tamponade. Mitral Valve: The mitral valve is grossly normal. No evidence of mitral valve regurgitation. No evidence of mitral valve stenosis. Tricuspid Valve: The tricuspid valve is grossly normal. Tricuspid valve regurgitation is trivial. No evidence of tricuspid stenosis. Aortic Valve: The aortic valve is tricuspid. Aortic valve regurgitation is not visualized. No aortic stenosis is present. Pulmonic Valve: The pulmonic valve was grossly normal. Pulmonic valve regurgitation is not visualized. No evidence of pulmonic stenosis. Aorta: The aortic root and ascending aorta are structurally normal, with no evidence of dilitation. Venous: The inferior vena cava is normal in size with greater than 50% respiratory variability, suggesting right atrial pressure of 3 mmHg. IAS/Shunts: The atrial septum is grossly normal. Additional Comments: There is a small pleural effusion in the left lateral region.  LEFT VENTRICLE PLAX 2D LVIDd:         3.10 cm     Diastology LVIDs:         2.00 cm     LV e' medial:    6.09 cm/s LV PW:         0.90 cm     LV E/e' medial:  8.5 LV IVS:        1.20 cm     LV e' lateral:   10.70 cm/s LVOT diam:     1.90 cm     LV E/e' lateral: 4.8 LV SV:         36 LV SV Index:   22 LVOT Area:     2.84 cm  LV Volumes (MOD) LV vol d, MOD A2C: 32.3 ml LV vol d, MOD A4C: 27.3 ml LV vol s, MOD A2C: 9.0 ml LV vol s, MOD A4C: 12.0 ml LV SV MOD A2C:     23.3 ml LV SV MOD A4C:     27.3 ml LV SV MOD BP:      24.9 ml RIGHT VENTRICLE            IVC RV S  prime:     9.66 cm/s  IVC diam: 1.90 cm TAPSE (M-mode): 2.5 cm LEFT ATRIUM             Index      RIGHT ATRIUM          Index LA diam:        3.20 cm 1.93 cm/m RA Area:     8.20 cm LA Vol (A2C):   9.0 ml  5.39 ml/m RA Volume:   15.60 ml 9.39 ml/m LA Vol (A4C):   8.7 ml  5.25 ml/m LA Biplane Vol: 8.8 ml  5.31 ml/m  AORTIC VALVE LVOT Vmax:   97.40 cm/s LVOT Vmean:  61.800 cm/s LVOT VTI:    0.128 m  AORTA Ao Root diam: 3.20 cm Ao Asc diam:  3.30 cm MITRAL VALVE               TRICUSPID VALVE MV Area (PHT): 6.71 cm    TR Peak grad:   37.7 mmHg MV Decel Time: 113 msec    TR Vmax:        307.00 cm/s MV E velocity: 51.80 cm/s MV A velocity: 80.60 cm/s  SHUNTS MV E/A ratio:  0.64  Systemic VTI:  0.13 m                            Systemic Diam: 1.90 cm Eleonore Chiquito MD Electronically signed by Eleonore Chiquito MD Signature Date/Time: 01/13/2021/1:25:04 PM    Final (Updated)         Scheduled Meds:  Chlorhexidine Gluconate Cloth  6 each Topical Daily   enoxaparin (LOVENOX) injection  40 mg Subcutaneous Q24H   Continuous Infusions:  ampicillin-sulbactam (UNASYN) IV 3 g (01/14/21 0906)          Aline August, MD [facility] 01/14/2021, 10:32 AM

## 2021-01-14 NOTE — Consult Note (Signed)
[facility]  Telephone:([phone]) [phone] [phone]) [phone]   MEDICAL ONCOLOGY - INITIAL CONSULTATION  Referral MD: Dr. Aline August  Reason for Referral: Metastatic ovarian cancer  HPI: Chelsea Mcintosh is a 60 year old female with a past medical history significant for metastatic ovarian cancer, Crohn's disease, anxiety, GERD, hyperlipidemia.  The patient was previously on treatment in [place] and recently relocated to [place], [place].  She moved to [facility] about 4 days prior to this hospital admission.  She presented to the hospital with shortness of breath, chest pain, and back pain.  The patient was found to have pneumonia in [place] and was admitted to an outside facility there.  She was discharged home on Augmentin and was told that she needed to be on O2 at 2 L/min, but she was reportedly discharged without supplemental oxygen.  She came to [facility] to be closer to her parents who assist with her care.  In our emergency department, she was found to be hypoxic and had bilateral pulmonary infiltrate seen on chest x-ray.  Outside records have been reviewed through care everywhere and during her hospital admission, it was recommended that she be discharged home with hospice.  The patient has not enrolled with hospice as she is still interested in trying to pursue chemotherapy.  She is agreeable to palliative care services however.  Review of her oncologic records through care everywhere show that she had an exploratory laparotomy, TAH, bilateral salpingooophorectomy, resection of left pelvic lymph node, para-aortic lymph node dissection, omentectomy, and sigmoid colectomy on 01/30/2018.  Pathology result details are listed below but briefly she had a 10.5 x 7.0 x 5.5 cm serous carcinoma that was high-grade.  11 lymph nodes were examined and 1 demonstrated metastasis.  She was staged as pT3, pN1, pMX.  She received systemic chemotherapy initially with 6 cycles of carboplatin  and Taxol from 03/28/2018 through 07/11/2018. She then received another 4 cycles of carboplatin and Taxol from 02/13/2019 through 04/24/2019.  CT of the chest/abdomen/pelvis performed on 08/05/2019 showed interval surgical changes otherwise no evidence of remote metastatic disease, stable small lymph nodes in the retroperitoneum.  A scan performed on 10/04/2019 showed mildly enlarged left axillary lymph nodes which were new, mildly enlarged left periaortic and right common iliac lymph node which was more prominent.  She had another CT of the chest/abdomen/pelvis performed on 01/12/2020 which showed interval development of peripherally enhancing lesions at the vaginal cuff and left adnexa with new and enlarging abdominal pelvic lymph nodes compatible with disease progression, previously seen mildly enlarged left axillary and subpectoral lymph nodes have decreased in size.  It appears that she was receiving Doxil every 4 weeks and received 8 cycles of this drug which was last given on 05/26/2020.  According to records, she had multiple "hold" due to neutropenia.  She had another CT of the chest/abdomen/pelvis performed on 08/05/2020 which showed interval development of new pulmonary nodules worrisome for metastases, new lymph node in the left lower neck with low-attenuation worrisome for some metastasis, interval worsening of abdominal and pelvic adenopathy since prior exam, interval worsening of the enhancing soft tissue mass posterior to the urinary bladder.  She had an MRI of the lumbar spine performed on 09/08/2020 which showed a 13 x 11 x 6.5 mm focus of abnormal increased signal on the right side of the L3 vertebral body with an adjoining soft tissue lesion (probably lymph node) concerning for metastatic disease, no evidence of pathologic fracture or spinal cord compromise.  She received systemic  chemotherapy with gemcitabine given day 1 and day 8 of a 21-day cycle starting on 10/22/2020 and her last dose was given on  12/17/2020.  CT of the chest/abdomen/pelvis show progression of her pulmonary metastasis, intrathoracic and mesenteric lymphadenopathy, left neck lymphadenopathy with slight improvement in retroperitoneal lymphadenopathy, small pleural effusions likely malignant, partial colectomy with left lower quadrant colostomy and rectal stump, duodenal wall thickening with periduodenal inflammatory changes. A CTA of the chest performed on 01/02/2021 showed no evidence of PE but did show moderate to large bilateral pleural effusions which were worsening from the prior study, widespread metastatic disease throughout the lungs, mediastinum, hila which has progressed compared to the prior CT.  She was hospitalized 12/31/2020 through 01/09/2021.  She underwent a left thoracentesis during this hospitalization on 01/05/2021 which was negative for malignant cells.  Her CA125 was normal at 16 on 04/22/2019.  Her last CA125 was performed on 12/29/2020 and was 313.  This has been rising slowly since May 2022.  When seen today, the patient is very tired.  At the time that I saw her, I did not have access to care everywhere to see her prior records.  She tells me that she stopped chemotherapy because it was not working but also because she had difficulty with her blood counts.  It appears that she had some neutropenia.  She also tells me that during chemotherapy, she had issues with nausea and vomiting.  She also reports that she has lost weight and has a poor appetite.  She reports some chest tightness today, shortness of breath, and cough.  She is not having any headaches or dizziness.  She currently denies abdominal pain, nausea, vomiting.  The patient is single.  She has 1 child.  She is currently living in [place], [place] with her parents.  She denies history of alcohol and tobacco use.  Denies family history of malignancy.  Medical oncology was asked see the patient make recommendations regarding her metastatic ovarian  cancer.  Past Medical History:  Diagnosis Date   Ovarian cancer ([place])   :  History reviewed. No pertinent surgical history.:   Current Facility-Administered Medications  Medication Dose Route Frequency Provider Last Rate Last Admin   acetaminophen (TYLENOL) tablet 650 mg  650 mg Oral Q6H PRN Hall, Carole N, DO       Ampicillin-Sulbactam (UNASYN) 3 g in sodium chloride 0.9 % 100 mL IVPB  3 g Intravenous Q6H Alekh, Kshitiz, MD 200 mL/hr at 01/14/21 0906 3 g at 01/14/21 0906   Chlorhexidine Gluconate Cloth 2 % PADS 6 each  6 each Topical Daily Irene Pap N, DO       enoxaparin (LOVENOX) injection 40 mg  40 mg Subcutaneous Q24H Hall, Carole N, DO   40 mg at 01/14/21 0905   HYDROmorphone (DILAUDID) injection 1 mg  1 mg Intravenous Q1H PRN Ripley Fraise, MD   1 mg at 01/14/21 0935   LORazepam (ATIVAN) injection 0.5 mg  0.5 mg Intravenous Q6H PRN Irene Pap N, DO   0.5 mg at 01/14/21 0535   melatonin tablet 3 mg  3 mg Oral QHS PRN Irene Pap N, DO       metoprolol tartrate (LOPRESSOR) injection 2.5 mg  2.5 mg Intravenous Q6H PRN Irene Pap N, DO       naloxone [facility]) injection 0.4 mg  0.4 mg Intravenous PRN Irene Pap N, DO       ondansetron (ZOFRAN) injection 4 mg  4 mg Intravenous Q6H PRN [place] Crane,  Lorenda Cahill, DO       oxyCODONE (Oxy IR/ROXICODONE) immediate release tablet 5 mg  5 mg Oral Q6H PRN Irene Pap N, DO   5 mg at 01/14/21 0534   polyethylene glycol (MIRALAX / GLYCOLAX) packet 17 g  17 g Oral Daily PRN Irene Pap N, DO       sodium chloride flush (NS) 0.9 % injection 10-40 mL  10-40 mL Intracatheter PRN Kayleen Memos, DO          Allergies  Allergen Reactions   Vancomycin Itching   Flagyl [Metronidazole]     Pt states it made her "feel weird and nauseous"   Sulfasalazine     Severe burning sensation in the esophagus.   Tape     Burning sensation of the skin if left on too long  :  History reviewed. No pertinent family history.:   Social History    Socioeconomic History   Marital status: Single    Spouse name: Not on file   Number of children: Not on file   Years of education: Not on file   Highest education level: Not on file  Occupational History   Not on file  Tobacco Use   Smoking status: Not on file   Smokeless tobacco: Not on file  Substance and Sexual Activity   Alcohol use: Not on file   Drug use: Not on file   Sexual activity: Not on file  Other Topics Concern   Not on file  Social History Narrative   Not on file   Social Determinants of Health   Financial Resource Strain: Not on file  Food Insecurity: Not on file  Transportation Needs: Not on file  Physical Activity: Not on file  Stress: Not on file  Social Connections: Not on file  Intimate Partner Violence: Not on file  :  Review of Systems: A comprehensive 14 point review of systems was negative except as noted in the HPI.  Exam: Patient Vitals for the past 24 hrs:  BP Temp Temp src Pulse Resp SpO2  01/14/21 0732 (!) 141/94 (!) 97.5 F (36.4 C) Oral (!) 121 15 97 %  01/14/21 0430 (!) 144/89 98.4 F (36.9 C) Oral (!) 105 16 97 %  01/14/21 0000 (!) 137/95 98.2 F (36.8 C) Oral (!) 101 17 98 %  01/13/21 2000 128/79 98.3 F (36.8 C) Oral 100 17 99 %  01/13/21 1730 -- 97.9 F (36.6 C) Oral -- -- --  01/13/21 1715 132/89 -- -- (!) 113 15 95 %  01/13/21 1600 137/80 -- -- (!) 114 (!) 28 95 %  01/13/21 1515 132/75 -- -- (!) 110 (!) 27 97 %  01/13/21 1430 122/82 -- -- (!) 114 (!) 22 96 %  01/13/21 1415 124/80 -- -- (!) 110 (!) 28 96 %  01/13/21 1345 135/88 -- -- (!) 118 20 94 %  01/13/21 1333 124/90 -- -- -- -- --  01/13/21 1300 -- -- -- (!) 121 (!) 21 95 %  01/13/21 1215 -- -- -- (!) 119 14 94 %  01/13/21 1100 (!) 153/121 -- -- (!) 120 20 95 %  01/13/21 1045 (!) 129/109 -- -- (!) 119 20 94 %  01/13/21 1030 (!) 153/100 -- -- (!) 117 19 95 %  01/13/21 1015 (!) 145/94 -- -- (!) 117 (!) 23 96 %    General: Tired appearing female, appears  tachypneic. Eyes:  no scleral icterus.   ENT:  There were no oropharyngeal lesions.  Lymphatics:  Negative cervical, supraclavicular or axillary adenopathy.   Respiratory: Diminished breath sounds bilaterally, she has a few scattered crackles. Cardiovascular: Tachycardic, trace lower extremity edema.   GI: Positive bowel sounds, mildly distended, nontender, ostomy in the left lower quadrant. Musculoskeletal: Strength symmetrical in the upper and lower extremities. Skin exam was without echymosis, petichae.   Neuro exam was nonfocal. Patient was alert and oriented.  Slow to respond at times.   Lab Results  Component Value Date   WBC 5.5 01/14/2021   HGB 12.1 01/14/2021   HCT 39.6 01/14/2021   PLT 274 01/14/2021   GLUCOSE 98 01/14/2021   ALT 18 01/12/2021   AST 25 01/12/2021   NA 136 01/14/2021   K 3.7 01/14/2021   CL 99 01/14/2021   CREATININE 0.58 01/14/2021   BUN 5 (L) 01/14/2021   CO2 28 01/14/2021    DG Chest 2 View  Result Date: 01/12/2021 CLINICAL DATA:  History of stage IV ovarian cancer presenting with shortness of breath. EXAM: CHEST - 2 VIEW COMPARISON:  None. FINDINGS: A right-sided venous Port-A-Cath is seen with its distal tip noted within the right atrium. This is approximately 2.5 cm distal to the junction of the superior vena cava and right atrium. Innumerable ill-defined multifocal opacities are seen scattered throughout both lungs. Mild atelectasis and/or infiltrate is also seen within the lateral aspect of the right lung base. A very small right pleural effusion is seen. No pneumothorax is identified. The heart size and mediastinal contours are within normal limits. The visualized skeletal structures are unremarkable. IMPRESSION: 1. Findings which may represent marked severity bilateral multifocal infiltrates. Given the patient's history of stage IV ovarian cancer, sequelae associated with diffuse pulmonary metastasis cannot be excluded. 2. Mild right basilar  atelectasis and/or infiltrate with a very small right pleural effusion. Electronically Signed   By: Virgina Norfolk M.D.   On: 01/12/2021 22:41   ECHOCARDIOGRAM COMPLETE  Result Date: 01/13/2021    ECHOCARDIOGRAM REPORT   Patient Name:   MARITERE BRUNJES Date of Exam: 01/13/2021 Medical Rec #:  [id number]         Height:       63.0 in Accession #:    [id number]        Weight:       140.0 lb Date of Birth:  [birth year]          BSA:          1.662 m Patient Age:    54 years          BP:           129/109 mmHg Patient Gender: F                 HR:           120 bpm. Exam Location:  Inpatient Procedure: 2D Echo, Cardiac Doppler and Color Doppler Indications:     Elevated troponin.; R07.9* Chest pain, unspecified; R06.02 SOB  History:         Patient has no prior history of Echocardiogram examinations.                  Signs/Symptoms:Chest Pain, Dyspnea and Shortness of Breath.                  Cancer. Pneumonia.  Sonographer:     Roseanna Rainbow RDCS Referring Phys:  [id number] [place] Diagnosing Phys: Eleonore Chiquito MD  Sonographer Comments: Technically difficult study due to poor echo  windows. IMPRESSIONS  1. There is a small to moderate pericardial effusion (up to 0.9 cm) located near the apex of the LV/RV. There is no RV/RA collapse in diastole. The IVC is collapsing. There is no evidence of pericardial tamponade. Moderate pericardial effusion. The pericardial effusion is surrounding the apex. There is no evidence of cardiac tamponade.  2. Left ventricular ejection fraction, by estimation, is >75%. The left ventricle has hyperdynamic function. The left ventricle has no regional wall motion abnormalities. There is mild asymmetric left ventricular hypertrophy of the basal-septal segment.  Indeterminate diastolic filling due to E-A fusion.  3. Right ventricular systolic function is hyperdynamic. The right ventricular size is normal. There is mildly elevated pulmonary artery systolic pressure. The estimated right  ventricular systolic pressure is [id number] mmHg.  4. The mitral valve is grossly normal. No evidence of mitral valve regurgitation. No evidence of mitral stenosis.  5. The aortic valve is tricuspid. Aortic valve regurgitation is not visualized. No aortic stenosis is present.  6. The inferior vena cava is normal in size with greater than 50% respiratory variability, suggesting right atrial pressure of 3 mmHg. Comparison(s): No prior Echocardiogram. FINDINGS  Left Ventricle: Left ventricular ejection fraction, by estimation, is >75%. The left ventricle has hyperdynamic function. The left ventricle has no regional wall motion abnormalities. The left ventricular internal cavity size was normal in size. There is mild asymmetric left ventricular hypertrophy of the basal-septal segment. Indeterminate diastolic filling due to E-A fusion. Right Ventricle: The right ventricular size is normal. No increase in right ventricular wall thickness. Right ventricular systolic function is hyperdynamic. There is mildly elevated pulmonary artery systolic pressure. The tricuspid regurgitant velocity is 3.07 m/s, and with an assumed right atrial pressure of 3 mmHg, the estimated right ventricular systolic pressure is [id number] mmHg. Left Atrium: Left atrial size was normal in size. Right Atrium: Right atrial size was normal in size. Pericardium: There is a small to moderate pericardial effusion (up to 0.9 cm) located near the apex of the LV/RV. There is no RV/RA collapse in diastole. The IVC is collapsing. There is no evidence of pericardial tamponade. A moderately sized pericardial  effusion is present. The pericardial effusion is surrounding the apex. There is no evidence of cardiac tamponade. Mitral Valve: The mitral valve is grossly normal. No evidence of mitral valve regurgitation. No evidence of mitral valve stenosis. Tricuspid Valve: The tricuspid valve is grossly normal. Tricuspid valve regurgitation is trivial. No evidence of tricuspid  stenosis. Aortic Valve: The aortic valve is tricuspid. Aortic valve regurgitation is not visualized. No aortic stenosis is present. Pulmonic Valve: The pulmonic valve was grossly normal. Pulmonic valve regurgitation is not visualized. No evidence of pulmonic stenosis. Aorta: The aortic root and ascending aorta are structurally normal, with no evidence of dilitation. Venous: The inferior vena cava is normal in size with greater than 50% respiratory variability, suggesting right atrial pressure of 3 mmHg. IAS/Shunts: The atrial septum is grossly normal. Additional Comments: There is a small pleural effusion in the left lateral region.  LEFT VENTRICLE PLAX 2D LVIDd:         3.10 cm     Diastology LVIDs:         2.00 cm     LV e' medial:    6.09 cm/s LV PW:         0.90 cm     LV E/e' medial:  8.5 LV IVS:        1.20 cm     LV  e' lateral:   10.70 cm/s LVOT diam:     1.90 cm     LV E/e' lateral: 4.8 LV SV:         36 LV SV Index:   22 LVOT Area:     2.84 cm  LV Volumes (MOD) LV vol d, MOD A2C: 32.3 ml LV vol d, MOD A4C: 27.3 ml LV vol s, MOD A2C: 9.0 ml LV vol s, MOD A4C: 12.0 ml LV SV MOD A2C:     23.3 ml LV SV MOD A4C:     27.3 ml LV SV MOD BP:      24.9 ml RIGHT VENTRICLE            IVC RV S prime:     9.66 cm/s  IVC diam: 1.90 cm TAPSE (M-mode): 2.5 cm LEFT ATRIUM             Index      RIGHT ATRIUM          Index LA diam:        3.20 cm 1.93 cm/m RA Area:     8.20 cm LA Vol (A2C):   9.0 ml  5.39 ml/m RA Volume:   15.60 ml 9.39 ml/m LA Vol (A4C):   8.7 ml  5.25 ml/m LA Biplane Vol: 8.8 ml  5.31 ml/m  AORTIC VALVE LVOT Vmax:   97.40 cm/s LVOT Vmean:  61.800 cm/s LVOT VTI:    0.128 m  AORTA Ao Root diam: 3.20 cm Ao Asc diam:  3.30 cm MITRAL VALVE               TRICUSPID VALVE MV Area (PHT): 6.71 cm    TR Peak grad:   37.7 mmHg MV Decel Time: 113 msec    TR Vmax:        307.00 cm/s MV E velocity: 51.80 cm/s MV A velocity: 80.60 cm/s  SHUNTS MV E/A ratio:  0.64        Systemic VTI:  0.13 m                             Systemic Diam: 1.90 cm Eleonore Chiquito MD Electronically signed by Eleonore Chiquito MD Signature Date/Time: 01/13/2021/1:25:04 PM    Final (Updated)      DG Chest 2 View  Result Date: 01/12/2021 CLINICAL DATA:  History of stage IV ovarian cancer presenting with shortness of breath. EXAM: CHEST - 2 VIEW COMPARISON:  None. FINDINGS: A right-sided venous Port-A-Cath is seen with its distal tip noted within the right atrium. This is approximately 2.5 cm distal to the junction of the superior vena cava and right atrium. Innumerable ill-defined multifocal opacities are seen scattered throughout both lungs. Mild atelectasis and/or infiltrate is also seen within the lateral aspect of the right lung base. A very small right pleural effusion is seen. No pneumothorax is identified. The heart size and mediastinal contours are within normal limits. The visualized skeletal structures are unremarkable. IMPRESSION: 1. Findings which may represent marked severity bilateral multifocal infiltrates. Given the patient's history of stage IV ovarian cancer, sequelae associated with diffuse pulmonary metastasis cannot be excluded. 2. Mild right basilar atelectasis and/or infiltrate with a very small right pleural effusion. Electronically Signed   By: Virgina Norfolk M.D.   On: 01/12/2021 22:41   ECHOCARDIOGRAM COMPLETE  Result Date: 01/13/2021    ECHOCARDIOGRAM REPORT   Patient Name:   LYRICC BORRA Date of Exam:  01/13/2021 Medical Rec #:  [id number]         Height:       63.0 in Accession #:    [id number]        Weight:       140.0 lb Date of Birth:  [birth year]          BSA:          1.662 m Patient Age:    58 years          BP:           129/109 mmHg Patient Gender: F                 HR:           120 bpm. Exam Location:  Inpatient Procedure: 2D Echo, Cardiac Doppler and Color Doppler Indications:     Elevated troponin.; R07.9* Chest pain, unspecified; R06.02 SOB  History:         Patient has no prior history of Echocardiogram  examinations.                  Signs/Symptoms:Chest Pain, Dyspnea and Shortness of Breath.                  Cancer. Pneumonia.  Sonographer:     Roseanna Rainbow RDCS Referring Phys:  [id number] [place] Diagnosing Phys: Eleonore Chiquito MD  Sonographer Comments: Technically difficult study due to poor echo windows. IMPRESSIONS  1. There is a small to moderate pericardial effusion (up to 0.9 cm) located near the apex of the LV/RV. There is no RV/RA collapse in diastole. The IVC is collapsing. There is no evidence of pericardial tamponade. Moderate pericardial effusion. The pericardial effusion is surrounding the apex. There is no evidence of cardiac tamponade.  2. Left ventricular ejection fraction, by estimation, is >75%. The left ventricle has hyperdynamic function. The left ventricle has no regional wall motion abnormalities. There is mild asymmetric left ventricular hypertrophy of the basal-septal segment.  Indeterminate diastolic filling due to E-A fusion.  3. Right ventricular systolic function is hyperdynamic. The right ventricular size is normal. There is mildly elevated pulmonary artery systolic pressure. The estimated right ventricular systolic pressure is [id number] mmHg.  4. The mitral valve is grossly normal. No evidence of mitral valve regurgitation. No evidence of mitral stenosis.  5. The aortic valve is tricuspid. Aortic valve regurgitation is not visualized. No aortic stenosis is present.  6. The inferior vena cava is normal in size with greater than 50% respiratory variability, suggesting right atrial pressure of 3 mmHg. Comparison(s): No prior Echocardiogram. FINDINGS  Left Ventricle: Left ventricular ejection fraction, by estimation, is >75%. The left ventricle has hyperdynamic function. The left ventricle has no regional wall motion abnormalities. The left ventricular internal cavity size was normal in size. There is mild asymmetric left ventricular hypertrophy of the basal-septal segment. Indeterminate  diastolic filling due to E-A fusion. Right Ventricle: The right ventricular size is normal. No increase in right ventricular wall thickness. Right ventricular systolic function is hyperdynamic. There is mildly elevated pulmonary artery systolic pressure. The tricuspid regurgitant velocity is 3.07 m/s, and with an assumed right atrial pressure of 3 mmHg, the estimated right ventricular systolic pressure is [id number] mmHg. Left Atrium: Left atrial size was normal in size. Right Atrium: Right atrial size was normal in size. Pericardium: There is a small to moderate pericardial effusion (up to 0.9 cm) located near the apex of the LV/RV. There is no RV/RA  collapse in diastole. The IVC is collapsing. There is no evidence of pericardial tamponade. A moderately sized pericardial  effusion is present. The pericardial effusion is surrounding the apex. There is no evidence of cardiac tamponade. Mitral Valve: The mitral valve is grossly normal. No evidence of mitral valve regurgitation. No evidence of mitral valve stenosis. Tricuspid Valve: The tricuspid valve is grossly normal. Tricuspid valve regurgitation is trivial. No evidence of tricuspid stenosis. Aortic Valve: The aortic valve is tricuspid. Aortic valve regurgitation is not visualized. No aortic stenosis is present. Pulmonic Valve: The pulmonic valve was grossly normal. Pulmonic valve regurgitation is not visualized. No evidence of pulmonic stenosis. Aorta: The aortic root and ascending aorta are structurally normal, with no evidence of dilitation. Venous: The inferior vena cava is normal in size with greater than 50% respiratory variability, suggesting right atrial pressure of 3 mmHg. IAS/Shunts: The atrial septum is grossly normal. Additional Comments: There is a small pleural effusion in the left lateral region.  LEFT VENTRICLE PLAX 2D LVIDd:         3.10 cm     Diastology LVIDs:         2.00 cm     LV e' medial:    6.09 cm/s LV PW:         0.90 cm     LV E/e' medial:   8.5 LV IVS:        1.20 cm     LV e' lateral:   10.70 cm/s LVOT diam:     1.90 cm     LV E/e' lateral: 4.8 LV SV:         36 LV SV Index:   22 LVOT Area:     2.84 cm  LV Volumes (MOD) LV vol d, MOD A2C: 32.3 ml LV vol d, MOD A4C: 27.3 ml LV vol s, MOD A2C: 9.0 ml LV vol s, MOD A4C: 12.0 ml LV SV MOD A2C:     23.3 ml LV SV MOD A4C:     27.3 ml LV SV MOD BP:      24.9 ml RIGHT VENTRICLE            IVC RV S prime:     9.66 cm/s  IVC diam: 1.90 cm TAPSE (M-mode): 2.5 cm LEFT ATRIUM             Index      RIGHT ATRIUM          Index LA diam:        3.20 cm 1.93 cm/m RA Area:     8.20 cm LA Vol (A2C):   9.0 ml  5.39 ml/m RA Volume:   15.60 ml 9.39 ml/m LA Vol (A4C):   8.7 ml  5.25 ml/m LA Biplane Vol: 8.8 ml  5.31 ml/m  AORTIC VALVE LVOT Vmax:   97.40 cm/s LVOT Vmean:  61.800 cm/s LVOT VTI:    0.128 m  AORTA Ao Root diam: 3.20 cm Ao Asc diam:  3.30 cm MITRAL VALVE               TRICUSPID VALVE MV Area (PHT): 6.71 cm    TR Peak grad:   37.7 mmHg MV Decel Time: 113 msec    TR Vmax:        307.00 cm/s MV E velocity: 51.80 cm/s MV A velocity: 80.60 cm/s  SHUNTS MV E/A ratio:  0.64        Systemic VTI:  0.13 m  Systemic Diam: 1.90 cm Eleonore Chiquito MD Electronically signed by Eleonore Chiquito MD Signature Date/Time: 01/13/2021/1:25:04 PM    Final (Updated)     Pathology:  Residual disease after surgery:  No visible   Pathology: SUMMARY OF FINDINGS (SPECIMENS A, B, C, D, E, F, G, AND H)   Specimen Identification  Specimen, procedure: Hysterectomy with bilateral salpingo-oophorectomy  and sigmoid colon resection.  Specimen integrity  ---Right ovary: Intact with fibrous adhesions  ---Left ovary: Fibrosis and fibrous adhesions with superficial tumor  involvement  ---Right fallopian tube: Intact with fibrous adhesions  ---Left fallopian tube: Fibrotic and fibrous adhesions with extensive  tumor involvement  Lymph nodes submitted and/or identified in the specimen: Yes   Primary tumor  site: Most likely left fallopian tube  Ovarian surface involvement: Left ovary  Fallopian tube surface involvement: Left fallopian tube  Tumor size: 10.5 x 7.0 x 5.5 cm  Histologic type: Serous carcinoma  Histologic grade: High grade  Implants: Not sampled  Involvement of other organs:  Sigmoid colon with serosal and muscularis  propria involvement  Largest extrapelvic peritoneal focus: n/a  Peritoneal/ascitic fluid: Not submitted    Total number of nodes examined: 11  ---Site: Three mesenteric lymph nodes, four pericolonic lymph nodes,  one left pelvic lymph node, two para-aortic lymph nodes and one left  para-aortic lymph node  Number of nodes with metastasis greater than 10 mm: 1  Number of nodes with metastasis 10 mm or less:  1  Number of nodes with isolated tumor cells (ITCs): 0  Location of largest deposit: Para-aortic lymph node (F5)  Size of largest metastatic deposit:  About 15 mm (F5)   Pathologic stage (AJCC 8th Edition): pT3, pN1, pMX   Assessment and Plan:  This is a 60 year old female with 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  At the time of diagnosis, it appears that she had stage III disease.  She had evidence of disease recurrence in the pelvis and lymph nodes noted on CT scan in December 2020.  She developed pulmonary metastases in February 2022.  Her Ca1 25 has slowly been rising.  She has received several lines of chemotherapy including carboplatin/Taxol, Doxil, and most recently single agent gemcitabine.  Due to continued worsening disease, the patient was referred to hospice during her most recent hospitalization.  However, the patient is not interested in pursuing hospice at this time and would like to discuss additional treatment options.  Currently she is hospitalized with multifocal pneumonia with hypoxia.  She continues to have shortness of breath, cough, and tachypnea.  ##Metastatic ovarian cancer --Outside records have been  extensively reviewed and outlined above. --Due to her continued disease progression and rapid decline, she was deemed not to be a candidate for further systemic chemotherapy. --She will be seen later today by Dr. Lorenso Courier, but we agree with palliative care consultation and discussion of goals of care. --We will begin treatment with transitioning to hospice.  Recommend DNR/DNI.  ##Multifocal pneumonia/acute respiratory failure/hypoxia --She is currently on treatment for possible pneumonia, but respiratory failure/hypoxia may also be due to underlying, extensive pulmonary metastases. --Recommend continuation of treatment for pneumonia to see if she improves. --Palliative care following and assisting with symptom management.  Thank you for this referral.   Chelsea Bussing, DNP, AGPCNP-BC, AOCNP

## 2021-01-14 NOTE — Consult Note (Signed)
Consultation Note Date: 01/14/2021   Patient Name: Chelsea Mcintosh  DOB: 1960-11-02  MRN: [id number]  Age / Sex: 60 y.o., female  PCP: System, Provider Not In Referring Physician: Aline August, MD  Reason for Consultation: Establishing goals of care  HPI/Patient Profile: 60 y.o. female  with past medical history of metastatic ovarian cancer (mets to colon and lungs- she has a colostomy bag) (on treatment in [place], recently relocated to [facility]) admitted on 01/12/2021 with worsening shortness of breath. Chest xray showed bilateral multifocal infiltrates- unclear if this is pneumonia or metastatic disease. She does not have an elevated WBC, procalcitonin and lactic acid were normal. Palliative medicine consulted for goals of care.    Clinical Assessment and Goals of Care: Met with Chelsea Mcintosh in person. She was sleeping but awoke easily. She was horse and lethargic, labored breathing while talking.  She moved to [place] recently to be closer to her parents and her grandchildren. She was receiving chemotherapy treatments in [place] prior to moving.  She had requested to be enrolled under outpatient Palliative when she moved here so that she could continue her chemotherapy treatments. She understood that she is not able to continue chemotherapy with hospice.  Her current goals are to make all efforts to stabilize her respiratory status and continue chemotherapy treatments for her cancer. Oncology has been consulted. Her outside records are currently unavailable.  I called and spoke with her [name]. [name] shares that Chelsea arrived to [place] on Monday via airplane. She was able to ambulate and independent with all of her ADL's. Per [name] patient was told in [place] that she "needed Hospice" it is unclear if she was told there were no further options for therapy. [name] also shared  that patient was upset from previous discussion with a provider regarding advanced directives and code status. Call was placed to [facility]- they received referral from hospital in [place] on Sunday. At that time their notes reflect that "patient believes in a miracle" was having difficulty accepting hospice, and that mother did not want hospice due to previous bad experience with Hospice. Patient had also stated she did not want to hear "doom and gloom".   Primary Decision Maker PATIENT    SUMMARY OF RECOMMENDATIONS -Patient requests discussion with palliative provider and her mother- will arrange this over the next few days -Will look for oncology information regarding if patient has any further options for chemotherapy- likely need outside records -Patient's stated goals for now are to continue full scope treatment- more chemotherapy if possible, full code -Followup goals of care discussion will need to be held after oncology consult and more information about patient's options are received    Code Status/Advance Care Planning: Full code  Discharge Planning: To Be Determined  Primary Diagnoses: Present on Admission:  HCAP (healthcare-associated pneumonia)   I have reviewed the medical record, interviewed the patient and family, and examined the patient. The following aspects are pertinent.  Past Medical History:  Diagnosis Date   Ovarian  cancer [facility])    Social History   Socioeconomic History   Marital status: Single    Spouse name: Not on file   Number of children: Not on file   Years of education: Not on file   Highest education level: Not on file  Occupational History   Not on file  Tobacco Use   Smoking status: Not on file   Smokeless tobacco: Not on file  Substance and Sexual Activity   Alcohol use: Not on file   Drug use: Not on file   Sexual activity: Not on file  Other Topics Concern   Not on file  Social History Narrative   Not on file   Social  Determinants of Health   Financial Resource Strain: Not on file  Food Insecurity: Not on file  Transportation Needs: Not on file  Physical Activity: Not on file  Stress: Not on file  Social Connections: Not on file   Scheduled Meds:  Chlorhexidine Gluconate Cloth  6 each Topical Daily   enoxaparin (LOVENOX) injection  40 mg Subcutaneous Q24H   Continuous Infusions:  ampicillin-sulbactam (UNASYN) IV 3 g (01/14/21 0906)   PRN Meds:.acetaminophen, HYDROmorphone (DILAUDID) injection, LORazepam, melatonin, metoprolol tartrate, naLOXone (NARCAN)  injection, ondansetron (ZOFRAN) IV, oxyCODONE, polyethylene glycol, sodium chloride flush Medications Prior to Admission:  Prior to Admission medications   Medication Sig Start Date End Date Taking? Authorizing Provider  amoxicillin-clavulanate (AUGMENTIN) 875-125 MG tablet Take 1 tablet by mouth 2 (two) times daily. Take for 7 days. Started 7/24   Yes [provider]  budesonide-formoterol (SYMBICORT) 80-4.5 MCG/ACT inhaler Inhale 2 puffs into the lungs 2 (two) times daily. For 30 days.   Yes [provider]  diazepam (VALIUM) 5 MG tablet Take 5 mg by mouth every 6 (six) hours as needed for anxiety.   Yes [provider]  diphenhydrAMINE (BENADRYL) 25 MG tablet Take 25 mg by mouth every 6 (six) hours as needed for itching.   Yes [provider]  docusate sodium (COLACE) 100 MG capsule Take 100 mg by mouth daily as needed for mild constipation.   Yes [provider]  escitalopram (LEXAPRO) 10 MG tablet Take 10 mg by mouth daily.   Yes [provider]  famotidine (PEPCID) 20 MG tablet Take 20 mg by mouth daily.   Yes [provider]  fentaNYL (DURAGESIC) 25 MCG/HR Place 1 patch onto the skin every 3 (three) days.   Yes [provider]  guaifenesin (ROBITUSSIN) 100 MG/5ML syrup Take 200 mg by mouth 4 (four) times daily as needed for cough.   Yes [provider]  hydrocerin  (EUCERIN) CREA Apply 1 application topically daily as needed (dry skin).   Yes [provider]  ipratropium-albuterol (DUONEB) 0.5-2.5 (3) MG/3ML SOLN Take 3 mLs by nebulization every 6 (six) hours as needed (bronchospasms).   Yes [provider]  lidocaine-prilocaine (EMLA) cream Apply 1 application topically as needed (pain up to 20 doses). Apply to mediport one hour prior to chemotherapy cover with saran wrap.   Yes [provider]  methocarbamol (ROBAXIN) 500 MG tablet Take 500 mg by mouth at bedtime.   Yes [provider]  naloxone (NARCAN) nasal spray 4 mg/0.1 mL Place 1 spray into the nose daily as needed (narcotic overdose).   Yes [provider]  ondansetron (ZOFRAN) 4 MG tablet Take 4 mg by mouth every 8 (eight) hours as needed for nausea or vomiting.   Yes [provider]  oxyCODONE-acetaminophen (PERCOCET)  10-325 MG tablet Take 1 tablet by mouth every 6 (six) hours as needed for pain.   Yes [provider]  Polyethyl Glycol-Propyl Glycol (SYSTANE) 0.4-0.3 % SOLN Apply 1 drop to eye daily. Both eyes.   Yes [provider]  sucralfate (CARAFATE) 1 g tablet Take 1 g by mouth daily as needed (acid reflux).   Yes [provider]  SUMAtriptan (IMITREX) 50 MG tablet Take 50 mg by mouth every 2 (two) hours as needed for migraine. May repeat in 2 hours if headache persists or recurs.   Yes [provider]   Allergies  Allergen Reactions   Vancomycin Itching   Flagyl [Metronidazole]     Pt states it made her "feel weird and nauseous"   Sulfasalazine     Severe burning sensation in the esophagus.   Tape     Burning sensation of the skin if left on too long   Review of Systems  Physical Exam  Vital Signs: BP (!) 146/94 (BP Location: Right Arm)   Pulse (!) 119   Temp 97.8 F (36.6 C) (Oral)   Resp 15   Ht 5' 3"  (1.6 m)   Wt 63.5 kg   SpO2 97%   BMI 24.80 kg/m  Pain Scale: 0-10 POSS *See Group  Information*: 1-Acceptable,Awake and alert Pain Score: 0-No pain   SpO2: SpO2: 97 % O2 Device:SpO2: 97 % O2 Flow Rate: .O2 Flow Rate (L/min): 4 L/min  IO: Intake/output summary:  Intake/Output Summary (Last 24 hours) at 01/14/2021 1303 Last data filed at 01/14/2021 8022 Gross per 24 hour  Intake 120 ml  Output --  Net 120 ml    LBM: Last BM Date: 01/12/21 Baseline Weight: Weight: 63.5 kg Most recent weight: Weight: 63.5 kg     Palliative Assessment/Data: PPS: 20%     Thank you for this consult. Palliative medicine will continue to follow and assist as needed.   Time In: 1228 Time Out: 1341 Time Total: 73 mins Greater than 50%  of this time was spent counseling and coordinating care related to the above assessment and plan.  Signed by: Mariana Kaufman, AGNP-C Palliative Medicine    Please contact Palliative Medicine Team phone at [phone] for questions and concerns.  For individual provider: See Shea Evans

## 2021-01-15 DIAGNOSIS — C7800 Secondary malignant neoplasm of unspecified lung: Secondary | ICD-10-CM

## 2021-01-15 DIAGNOSIS — Z515 Encounter for palliative care: Secondary | ICD-10-CM

## 2021-01-15 LAB — PROCALCITONIN: Procalcitonin: <0.1 ng/mL

## 2021-01-15 LAB — MRSA NEXT GEN BY PCR, NASAL: MRSA by PCR Next Gen: NOT DETECTED

## 2021-01-15 MED ORDER — SENNOSIDES-DOCUSATE SODIUM 8.6-50 MG PO TABS
1.0000 | ORAL_TABLET | Freq: Two times a day (BID) | ORAL | Status: DC
  Administered 2021-01-15 – 2021-01-17 (×4): 1 via ORAL
  Filled 2021-01-15 (×4): qty 1

## 2021-01-15 MED ORDER — LORAZEPAM 2 MG/ML IJ SOLN
0.5000 mg | INTRAMUSCULAR | Status: DC | PRN
  Administered 2021-01-15 – 2021-01-16 (×6): 0.5 mg via INTRAVENOUS
  Filled 2021-01-15 (×6): qty 1

## 2021-01-15 MED ORDER — BENZONATATE 100 MG PO CAPS: 100.0000 mg | ORAL_CAPSULE | Freq: Three times a day (TID) | ORAL | Status: DC | PRN

## 2021-01-15 NOTE — Progress Notes (Signed)
Patient ID: Chelsea Mcintosh, female   DOB: Sep 08, 1960, 60 y.o.   MRN: [id number]  PROGRESS NOTE    Chelsea Mcintosh  [id number] DOB: January 21, 1961 DOA: 01/12/2021 PCP: System, Provider Not In   Brief Narrative:   60 y.o. female with medical history significant for ovarian cancer with diffuse metastasis who was supposed to start hospice care in [place] and recently admitted at outside facility in [place] for possible pneumonia with subsequent discharge on oral Augmentin presented with worsening shortness of breath, chest and back pain.  She recently moved to [place].  On presentation, she was hypoxic with oxygen saturations in the 70s on room air.  She was started on supplemental oxygen.  Chest x-ray showed bilateral pulmonary infiltrates.  She was started on antibiotics.  Assessment & Plan:   Multifocal pneumonia Acute respiratory failure with hypoxia -Patient was recently admitted at an outside facility in [place] for possible pneumonia and treated with antibiotics and discharged on Augmentin -Presented with worsening shortness of breath and chest x-ray showed multifocal pneumonia.  Continue Unasyn.  Procalcitonin still less than 0.1.  Cultures negative so far. -COVID and influenza test were negative on presentation. -Still requiring 4 L oxygen via nasal cannula. -Unclear if this is bacterial pneumonia versus infiltrates from ovarian metastatic cancer.  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  She is hesitant to make further decisions and currently is looking for aggressive care.  Spoke in detail with the patient and tried to convince her to think about hospice and DNR status. -Oncology and palliative care following.  Oncology is recommending transitioning to hospice and DNR/DNI status.  Elevated troponin Moderate pericardial effusion -Most  likely from demand ischemia from above.  Initial troponin 31.  Repeat troponin 18.  Echo showed EF of >75% with moderate pericardial effusion.   -Cardiology evaluation appreciated and recommend repeat echo in 3 to 4 weeks.  DVT prophylaxis: Subcutaneous Lovenox Code Status: Full Family Communication: None at bedside Disposition Plan: Status is: Inpatient  Remains inpatient appropriate because:Inpatient level of care appropriate due to severity of illness  Dispo:  Patient From: Home  Planned Disposition: Home  Medically stable for discharge: No    Consultants: Oncology/palliative care/cardiology  Procedures: Echo as below  Antimicrobials: Unasyn   Subjective: Patient seen and examined at bedside.  Poor historian.  Still short of breath with even minimal exertion.  No overnight fever or vomiting reported. Objective: Vitals:   01/14/21 2205 01/15/21 0000 01/15/21 0410 01/15/21 0718  BP: 108/66 130/74 (!) 149/87 129/83  Pulse: (!) 123 (!) 122 (!) 123 (!) 116  Resp: '17 16 19 18  '$ Temp: 98.1 F (36.7 C) 98.8 F (37.1 C) 98.2 F (36.8 C) 98 F (36.7 C)  TempSrc: Oral  Oral Oral  SpO2: 91% 93% 92% 92%  Weight:   64.6 kg   Height:        Intake/Output Summary (Last 24 hours) at 01/15/2021 0934 Last data filed at 01/15/2021 0659 Gross per 24 hour  Intake 440 ml  Output --  Net 440 ml    Filed Weights   01/13/21 0334 01/15/21 0410  Weight: 63.5 kg 64.6 kg    Examination:  General exam: Still on 4 L oxygen via nasal cannula.  Looks chronically ill and deconditioned.   Respiratory system: Decreased breath sounds at bases bilaterally with some crackles.  Intermittently tachypneic cardiovascular  system: Tachycardic, S1-S2 heard  gastrointestinal system: Abdomen is still distended, soft and nontender.  Ostomy bag present.  Bowel sounds are heard Extremities: Bilateral lower extremity edema present; no cyanosis Central nervous system: Still very slow to respond to questions.   Extremely poor historian. No focal neurological deficits.  Moves extremities  skin: No obvious ecchymosis/lesions  psychiatry: Gets anxious intermittently.  Affect is extremely flat.   Data Reviewed: I have personally reviewed following labs and imaging studies  CBC: Recent Labs  Lab 01/12/21 2207 01/13/21 0344 01/14/21 0546  WBC 7.5 7.7 5.5  NEUTROABS 6.1  --   --   HGB 14.2 12.3 12.1  HCT 45.4 40.5 39.6  MCV 90.1 90.2 90.0  PLT 310 [id number]    Basic Metabolic Panel: Recent Labs  Lab 01/12/21 2207 01/13/21 0344 01/14/21 0546  NA 135 135 136  K 3.9 3.8 3.7  CL 99 102 99  CO2 '25 23 28  '$ GLUCOSE 97 101* 98  BUN 5* <5* 5*  CREATININE 0.60 0.60 0.58  CALCIUM 8.9 8.2* 8.7*    GFR: Estimated Creatinine Clearance: 66 mL/min (by C-G formula based on SCr of 0.58 mg/dL). Liver Function Tests: Recent Labs  Lab 01/12/21 2207  AST 25  ALT 18  ALKPHOS 74  BILITOT 0.5  PROT 6.9  ALBUMIN 2.6*    No results for input(s): LIPASE, AMYLASE in the last 168 hours. No results for input(s): AMMONIA in the last 168 hours. Coagulation Profile: Recent Labs  Lab 01/12/21 2207  INR 1.2    Cardiac Enzymes: No results for input(s): CKTOTAL, CKMB, CKMBINDEX, TROPONINI in the last 168 hours. BNP (last 3 results) No results for input(s): PROBNP in the last 8760 hours. HbA1C: No results for input(s): HGBA1C in the last 72 hours. CBG: Recent Labs  Lab 01/14/21 1709  GLUCAP 91   Lipid Profile: No results for input(s): CHOL, HDL, LDLCALC, TRIG, CHOLHDL, LDLDIRECT in the last 72 hours. Thyroid Function Tests: No results for input(s): TSH, T4TOTAL, FREET4, T3FREE, THYROIDAB in the last 72 hours. Anemia Panel: No results for input(s): VITAMINB12, FOLATE, FERRITIN, TIBC, IRON, RETICCTPCT in the last 72 hours. Sepsis Labs: Recent Labs  Lab 01/12/21 2207 01/13/21 0344 01/14/21 0546 01/15/21 0441  PROCALCITON  --  <0.10 <0.10 <0.10  LATICACIDVEN 1.6  --   --   --       Recent Results (from the past 240 hour(s))  Blood culture (routine x 2)     Status: None (Preliminary result)   Collection Time: 01/12/21 10:07 PM   Specimen: BLOOD  Result Value Ref Range Status   Specimen Description BLOOD LEFT ANTECUBITAL  Final   Special Requests   Final    BOTTLES DRAWN AEROBIC AND ANAEROBIC Blood Culture adequate volume   Culture   Final    NO GROWTH < 12 HOURS Performed at [facility], [place] [address], [place], [place] [address]    Report Status PENDING  Incomplete  Blood culture (routine x 2)     Status: None (Preliminary result)   Collection Time: 01/12/21 10:07 PM   Specimen: BLOOD RIGHT ARM  Result Value Ref Range Status   Specimen Description BLOOD RIGHT ARM  Final   Special Requests   Final    BOTTLES DRAWN AEROBIC ONLY Blood Culture adequate volume   Culture   Final    NO GROWTH < 12 HOURS Performed at [facility], [place] [address], [place], [place] [address]    Report  Status PENDING  Incomplete  Resp Panel by RT-PCR (Flu A&B, Covid) Nasopharyngeal Swab     Status: None   Collection Time: 01/13/21 12:38 AM   Specimen: Nasopharyngeal Swab; Nasopharyngeal(NP) swabs in vial transport medium  Result Value Ref Range Status   SARS Coronavirus 2 by RT PCR NEGATIVE NEGATIVE Final    Comment: (NOTE) SARS-CoV-2 target nucleic acids are NOT DETECTED.  The SARS-CoV-2 RNA is generally detectable in upper respiratory specimens during the acute phase of infection. The lowest concentration of SARS-CoV-2 viral copies this assay can detect is 138 copies/mL. A negative result does not preclude SARS-Cov-2 infection and should not be used as the sole basis for treatment or other patient management decisions. A negative result may occur with  improper specimen collection/handling, submission of specimen other than nasopharyngeal swab, presence of viral mutation(s) within the areas targeted by this assay, and inadequate number of viral copies(<138  copies/mL). A negative result must be combined with clinical observations, patient history, and epidemiological information. The expected result is Negative.  Fact Sheet for Patients:  EntrepreneurPulse.com.au  Fact Sheet for Healthcare Providers:  IncredibleEmployment.be  This test is no t yet approved or cleared by the [place] FDA and  has been authorized for detection and/or diagnosis of SARS-CoV-2 by FDA under an Emergency Use Authorization (EUA). This EUA will remain  in effect (meaning this test can be used) for the duration of the COVID-19 declaration under Section 564(b)(1) of the Act, 21 U.S.C.section 360bbb-3(b)(1), unless the authorization is terminated  or revoked sooner.       Influenza A by PCR NEGATIVE NEGATIVE Final   Influenza B by PCR NEGATIVE NEGATIVE Final    Comment: (NOTE) The Xpert Xpress SARS-CoV-2/FLU/RSV plus assay is intended as an aid in the diagnosis of influenza from Nasopharyngeal swab specimens and should not be used as a sole basis for treatment. Nasal washings and aspirates are unacceptable for Xpert Xpress SARS-CoV-2/FLU/RSV testing.  Fact Sheet for Patients: EntrepreneurPulse.com.au  Fact Sheet for Healthcare Providers: IncredibleEmployment.be  This test is not yet approved or cleared by the [place] FDA and has been authorized for detection and/or diagnosis of SARS-CoV-2 by FDA under an Emergency Use Authorization (EUA). This EUA will remain in effect (meaning this test can be used) for the duration of the COVID-19 declaration under Section 564(b)(1) of the Act, 21 U.S.C. section 360bbb-3(b)(1), unless the authorization is terminated or revoked.  Performed at [facility], [place] [address] [place], [place] [address]   MRSA Next Gen by PCR, Nasal     Status: None   Collection Time: 01/14/21 10:22 PM   Specimen: Nasal Mucosa; Nasal Swab  Result  Value Ref Range Status   MRSA by PCR Next Gen NOT DETECTED NOT DETECTED Final    Comment: (NOTE) The GeneXpert MRSA Assay (FDA approved for NASAL specimens only), is one component of a comprehensive MRSA colonization surveillance program. It is not intended to diagnose MRSA infection nor to guide or monitor treatment for MRSA infections. Test performance is not FDA approved in patients less than 77 years old. Performed at [facility], [place] [address] [place],  [address]           Radiology Studies: DG Swallowing Func-Speech Pathology  Result Date: 01/14/2021 Formatting of this result is different from the original. Objective Swallowing Evaluation: Type of Study: MBS-Modified Barium Swallow Study  Patient Details Name: CHARESE WERGIN MRN: [id number] Date of Birth: May 11, 1961 Today's Date: 01/14/2021 Time: SLP  Start Time (ACUTE ONLY): 1305 -SLP Stop Time (ACUTE ONLY): 1323 SLP Time Calculation (min) (ACUTE ONLY): 18 min Past Medical History: Past Medical History: Diagnosis Date  Ovarian cancer [facility])  Past Surgical History: No past surgical history on file. HPI: Pt is a 60 y.o. female who presented to the ED with shortness of breath, and chest and back pain. CXR 7/27: marked severity bilateral multifocal infiltrates. Given the patient's history of stage IV ovarian cancer, sequelae associated with diffuse pulmonary metastasis cannot be  excluded. Mild right basilar atelectasis and/or infiltrate with a very small right pleural effusion. Palliative care has been consulted to help establish [place] since pt was receiving hospice care in [place]. PMH: ovarian cancer with diffuse metastasis previously under hospice care and recently admitted at outside facility in [place], but moved to [place] to be closer to family.  No data recorded Assessment / Plan / Recommendation CHL IP CLINICAL IMPRESSIONS 01/14/2021 Clinical Impression Pt was seen in radiology suite for modified barium swallow study.  Trials of puree solids, regular texture solids, a 2m barium tablet, and thin liquids via cup and straw were administered. Pt's oropharyngeal swallow mechanism was within functional limits. Oral phase was mildly prolonged, but pt stated that she practices mindful eating and therefore enjoys taking extra time and care to consider the textures and/or various flavors of foods while eating. Secondary swallows were due to mild oral residue, and pt's desire to completely clear the oral cavity but [facility] No instances of penetration or aspiration were demonstrated even when her swallow was challenged. It is recommended that a regular texture diet with thin liquids be continued at this time. Further skilled SLP services are not clinically indicated at this time. SLP Visit Diagnosis Dysphagia, unspecified (R13.10) Attention and concentration deficit following -- Frontal lobe and executive function deficit following -- Impact on safety and function --   CHL IP TREATMENT RECOMMENDATION 01/14/2021 Treatment Recommendations No treatment recommended at this time   No flowsheet data found. CHL IP DIET RECOMMENDATION 01/14/2021 SLP Diet Recommendations Regular solids;Thin liquid Liquid Administration via Cup;Straw Medication Administration Whole meds with liquid Compensations Slow rate;Small sips/bites Postural Changes Seated upright at 90 degrees   CHL IP OTHER RECOMMENDATIONS 01/14/2021 Recommended Consults -- Oral Care Recommendations Oral care BID Other Recommendations --   CHL IP FOLLOW UP RECOMMENDATIONS 01/14/2021 Follow up Recommendations None   CHL IP FREQUENCY AND DURATION 01/14/2021 Speech Therapy Frequency (ACUTE ONLY) min 2x/week Treatment Duration 2 weeks      CHL IP ORAL PHASE 01/14/2021 Oral Phase WFL Oral - Pudding Teaspoon -- Oral - Pudding Cup -- Oral - Honey Teaspoon -- Oral - Honey Cup -- Oral - Nectar Teaspoon -- Oral - Nectar Cup -- Oral - Nectar Straw -- Oral - Thin Teaspoon -- Oral - Thin Cup -- Oral -  Thin Straw -- Oral - Puree -- Oral - Mech Soft -- Oral - Regular -- Oral - Multi-Consistency -- Oral - Pill -- Oral Phase - Comment --  CHL IP PHARYNGEAL PHASE 01/14/2021 Pharyngeal Phase WFL Pharyngeal- Pudding Teaspoon -- Pharyngeal -- Pharyngeal- Pudding Cup -- Pharyngeal -- Pharyngeal- Honey Teaspoon -- Pharyngeal -- Pharyngeal- Honey Cup -- Pharyngeal -- Pharyngeal- Nectar Teaspoon -- Pharyngeal -- Pharyngeal- Nectar Cup -- Pharyngeal -- Pharyngeal- Nectar Straw -- Pharyngeal -- Pharyngeal- Thin Teaspoon -- Pharyngeal -- Pharyngeal- Thin Cup -- Pharyngeal -- Pharyngeal- Thin Straw -- Pharyngeal -- Pharyngeal- Puree -- Pharyngeal -- Pharyngeal- Mechanical Soft -- Pharyngeal -- Pharyngeal- Regular -- Pharyngeal -- Pharyngeal-  Multi-consistency -- Pharyngeal -- Pharyngeal- Pill -- Pharyngeal -- Pharyngeal Comment --  CHL IP CERVICAL ESOPHAGEAL PHASE 01/14/2021 Cervical Esophageal Phase WFL Pudding Teaspoon -- Pudding Cup -- Honey Teaspoon -- Honey Cup -- Nectar Teaspoon -- Nectar Cup -- Nectar Straw -- Thin Teaspoon -- Thin Cup -- Thin Straw -- Puree -- Mechanical Soft -- Regular -- Multi-consistency -- Pill -- Cervical Esophageal Comment -- Shanika I. Hardin Negus, [place], [place] Office number [phone] Pager [phone] Horton Marshall 01/14/2021, 1:56 PM              ECHOCARDIOGRAM COMPLETE  Result Date: 01/13/2021    ECHOCARDIOGRAM REPORT   Patient Name:   COZY EADS Date of Exam: 01/13/2021 Medical Rec #:  [id number]         Height:       63.0 in Accession #:    [id number]        Weight:       140.0 lb Date of Birth:  1960-06-25          BSA:          1.662 m Patient Age:    39 years          BP:           129/109 mmHg Patient Gender: F                 HR:           120 bpm. Exam Location:  Inpatient Procedure: 2D Echo, Cardiac Doppler and Color Doppler Indications:     Elevated troponin.; R07.9* Chest pain, unspecified; R06.02 SOB  History:         Patient has no prior  history of Echocardiogram examinations.                  Signs/Symptoms:Chest Pain, Dyspnea and Shortness of Breath.                  Cancer. Pneumonia.  Sonographer:     Roseanna Rainbow RDCS Referring Phys:  [id number] [place] Diagnosing Phys: Eleonore Chiquito MD  Sonographer Comments: Technically difficult study due to poor echo windows. IMPRESSIONS  1. There is a small to moderate pericardial effusion (up to 0.9 cm) located near the apex of the LV/RV. There is no RV/RA collapse in diastole. The IVC is collapsing. There is no evidence of pericardial tamponade. Moderate pericardial effusion. The pericardial effusion is surrounding the apex. There is no evidence of cardiac tamponade.  2. Left ventricular ejection fraction, by estimation, is >75%. The left ventricle has hyperdynamic function. The left ventricle has no regional wall motion abnormalities. There is mild asymmetric left ventricular hypertrophy of the basal-septal segment.  Indeterminate diastolic filling due to E-A fusion.  3. Right ventricular systolic function is hyperdynamic. The right ventricular size is normal. There is mildly elevated pulmonary artery systolic pressure. The estimated right ventricular systolic pressure is [id number].  4. The mitral valve is grossly normal. No evidence of mitral valve regurgitation. No evidence of mitral stenosis.  5. The aortic valve is tricuspid. Aortic valve regurgitation is not visualized. No aortic stenosis is present.  6. The inferior vena cava is normal in size with greater than 50% respiratory variability, suggesting right atrial pressure of 3 mmHg. Comparison(s): No prior Echocardiogram. FINDINGS  Left Ventricle: Left ventricular ejection fraction, by estimation, is >75%. The left ventricle has hyperdynamic function. The left ventricle has no regional wall motion abnormalities. The left ventricular  internal cavity size was normal in size. There is mild asymmetric left ventricular hypertrophy of the basal-septal  segment. Indeterminate diastolic filling due to E-A fusion. Right Ventricle: The right ventricular size is normal. No increase in right ventricular wall thickness. Right ventricular systolic function is hyperdynamic. There is mildly elevated pulmonary artery systolic pressure. The tricuspid regurgitant velocity is 3.07 m/s, and with an assumed right atrial pressure of 3 mmHg, the estimated right ventricular systolic pressure is [id number]. Left Atrium: Left atrial size was normal in size. Right Atrium: Right atrial size was normal in size. Pericardium: There is a small to moderate pericardial effusion (up to 0.9 cm) located near the apex of the LV/RV. There is no RV/RA collapse in diastole. The IVC is collapsing. There is no evidence of pericardial tamponade. A moderately sized pericardial  effusion is present. The pericardial effusion is surrounding the apex. There is no evidence of cardiac tamponade. Mitral Valve: The mitral valve is grossly normal. No evidence of mitral valve regurgitation. No evidence of mitral valve stenosis. Tricuspid Valve: The tricuspid valve is grossly normal. Tricuspid valve regurgitation is trivial. No evidence of tricuspid stenosis. Aortic Valve: The aortic valve is tricuspid. Aortic valve regurgitation is not visualized. No aortic stenosis is present. Pulmonic Valve: The pulmonic valve was grossly normal. Pulmonic valve regurgitation is not visualized. No evidence of pulmonic stenosis. Aorta: The aortic root and ascending aorta are structurally normal, with no evidence of dilitation. Venous: The inferior vena cava is normal in size with greater than 50% respiratory variability, suggesting right atrial pressure of 3 mmHg. IAS/Shunts: The atrial septum is grossly normal. Additional Comments: There is a small pleural effusion in the left lateral region.  LEFT VENTRICLE PLAX 2D LVIDd:         3.10 cm     Diastology LVIDs:         2.00 cm     LV e' medial:    6.09 cm/s LV PW:         0.90 cm      LV E/e' medial:  8.5 LV IVS:        1.20 cm     LV e' lateral:   10.70 cm/s LVOT diam:     1.90 cm     LV E/e' lateral: 4.8 LV SV:         36 LV SV Index:   22 LVOT Area:     2.84 cm  LV Volumes (MOD) LV vol d, MOD A2C: 32.3 ml LV vol d, MOD A4C: 27.3 ml LV vol s, MOD A2C: 9.0 ml LV vol s, MOD A4C: 12.0 ml LV SV MOD A2C:     23.3 ml LV SV MOD A4C:     27.3 ml LV SV MOD BP:      24.9 ml RIGHT VENTRICLE            IVC RV S prime:     9.66 cm/s  IVC diam: 1.90 cm TAPSE (M-mode): 2.5 cm LEFT ATRIUM             Index      RIGHT ATRIUM          Index LA diam:        3.20 cm 1.93 cm/m RA Area:     8.20 cm LA Vol (A2C):   9.0 ml  5.39 ml/m RA Volume:   15.60 ml 9.39 ml/m LA Vol (A4C):   8.7 ml  5.25 ml/m LA Biplane Vol: 8.8  ml  5.31 ml/m  AORTIC VALVE LVOT Vmax:   97.40 cm/s LVOT Vmean:  61.800 cm/s LVOT VTI:    0.128 m  AORTA Ao Root diam: 3.20 cm Ao Asc diam:  3.30 cm MITRAL VALVE               TRICUSPID VALVE MV Area (PHT): 6.71 cm    TR Peak grad:   37.7 mmHg MV Decel Time: 113 msec    TR Vmax:        307.00 cm/s MV E velocity: 51.80 cm/s MV A velocity: 80.60 cm/s  SHUNTS MV E/A ratio:  0.64        Systemic VTI:  0.13 m                            Systemic Diam: 1.90 cm Eleonore Chiquito MD Electronically signed by Eleonore Chiquito MD Signature Date/Time: 01/13/2021/1:25:04 PM    Final (Updated)         Scheduled Meds:  Chlorhexidine Gluconate Cloth  6 each Topical Daily   enoxaparin (LOVENOX) injection  40 mg Subcutaneous Q24H   Continuous Infusions:  ampicillin-sulbactam (UNASYN) IV 3 g (01/15/21 0757)          Aline August, MD [facility] 01/15/2021, 9:34 AM

## 2021-01-15 NOTE — Progress Notes (Addendum)
Manufacturing engineer [facility]) Nurse Liaison Note     Met at bedside with pt and [name].  Chelsea Mcintosh shares that they are still hoping to hear that chemotherapy is an option but are accepting that it likely is not.  Chelsea Mcintosh agreeable at this time to move forward with hospice services after discharge, understanding that if chemotherapy were offered pt would elect to pursue treatment.  Also clear is that pt and family wish for her to remain a full code.    Pt's [name] is best reached on her cell phone: [phone].  Chelsea Mcintosh also asked if pt needed to transfer her medicaid from [place] to [place].  This RN recommended asking TOC at [facility] or calling Medicaid directly, that [facility] liaisons were unable to advise on this.  DME needs discussed:  pt has no DME in the home.  Family requests a bed, over bed table, a 3N1 bedside commode, and oxygen be delivered to the home.  Address verified as correct in the chart.  Pt's mother asks to have tomorrow, Sunday, to get the room cleared out to make a place for the DME, and asks that DME could be delivered on Monday.    Contact information given to pt's mother.    Thank you for the opportunity to participate in this patient's care.     Domenic Moras, BSN, RN [facility] Liaison [phone] (24h on call)

## 2021-01-15 NOTE — Progress Notes (Addendum)
Palliative Medicine Inpatient Follow Up Note  HPI:  60 y.o. 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 Chest xray showed bilateral multifocal infiltrates- unclear if this is pneumonia or metastatic disease. She does not have an elevated WBC, procalcitonin and lactic acid were normal. Palliative medicine consulted for goals of care.    Today's Discussion (01/15/2021):  *Please note that this is a verbal dictation therefore any spelling or grammatical errors are due to the "[place] One" system interpretation.  Chart reviewed as were medical notes.  I met this morning at bedside with Chelsea Mcintosh.  We included her mother, Chelsea Mcintosh in the conversation via speaker phone.  We reviewed a brief history inclusive of Chelsea Mcintosh's metastatic ovarian cancer.  Discussed her transition from [place] to [place], [place].  Chelsea Mcintosh shares that she moved to be closer to her parents and is presently living with them.  She reviewed that her very good friend, Dr. Sharlett Mcintosh had set up for her to be enrolled in hospice when she transitioned to her home.  They could not remember the first hospice company they spoke with but they did share that they had a meeting scheduled with [facility] hospice services prior to admission.  They shared that this was not able to happen as there was an influx of symptoms that Chelsea Mcintosh experienced most notably shortness of breath.  Chelsea Mcintosh also shares that she had an extreme amount of pain in her back and abdomen which was relentless.  This then prompted hospitalization.  Chelsea Mcintosh shares that she "cannot die".  She reviews that her mother has lost her mother in the last year and is also previously lost one of her children.  She shares that if she dies and her mother has to  "bury her", and it will "destroy her."  She shares that she does not know what to do as her pain is very significant as is her shortness of breath.  She reviews that her cardiac monitor keeps going off because her heart rate is high and she does not understand this.  We reviewed that she has severe metastatic disease and possible PNA which is affecting her lungs and that is having an impact on her other organ systems inclusive of her heart causing it to work that much harder.  She continues to perseverate on this point though I shared the same information as a response.  Chelsea Mcintosh goes on to tell me that her good friend, Dr. Sharlett Mcintosh has strongly recommended not placing a DO NOT RESUSCITATE order as she should "at least try it".  We reviewed the many parts of cardiopulmonary resuscitation and how in Chelsea Mcintosh's case with a very severe underlying disease with no curative options it would not be of benefit for her to undergo CPR and/or intubation.  I shared my concerns that we would likely cause her body a tremendous amount of trauma and harm with little to no benefit. Provided "Hard Choices for Loving People" booklet and MOST form for her review with her family.  While at bedside, Dr. Lorenso Mcintosh was able to join in her goals of care discussion.  He reviewed in brief Chelsea Mcintosh's cancer diagnosis.  He reviewed that she had been already on multiple treatment options which unfortunately have not proven to be effective.  He shared that even if chemotherapy were an option  the likelihood of therapeutic benefit would be quite low.  He shared that given Chelsea Mcintosh's overall clinical state he would not at this time pursue additional chemotherapy.  Dr. Lorenso Mcintosh reviewed the importance of considering the quality of the life we are living.  He reviewed the risks of pursuing procedures and/or treatments that will possibly hasten the living process such as another round of chemotherapy.  Chelsea Mcintosh's mother, Chelsea Mcintosh had a hard time hearing this and  shared "it sounds all doom and gloom and that there are no options".  She goes on to share that they have a very strong religious faith and are still hoping for a "miracle".  Chelsea Mcintosh shares with her daughter that well Chelsea Mcintosh is in her right state of mind it is important that she decide what she wants and if she would like to pursue hospice that she alerts the rest of the family to this so they can help coordinate this.  Chelsea Mcintosh shares if it were up to her she would never stop providing treatment because every day with Chelsea Mcintosh no matter how difficult is another day with her living.  Chelsea Mcintosh states that she has another sister who she would like to speak with as she would like to speak with Dr. Sharlett Mcintosh in regards to additional decisions.  In the meanwhile she is open to hearing from [facility] regarding their services.  May symptom perspective Chelsea Mcintosh remains on Dilaudid as needed I have increased the frequency of the as needed Ativan.  Questions and concerns addressed   Objective Assessment: Vital Signs Vitals:   01/15/21 0718 01/15/21 1105  BP: 129/83 (!) 142/80  Pulse: (!) 116 99  Resp: 18 20  Temp: 98 F (36.7 C) 98.7 F (37.1 C)  SpO2: 92% 97%    Intake/Output Summary (Last 24 hours) at 01/15/2021 1251 Last data filed at 01/15/2021 0659 Gross per 24 hour  Intake 440 ml  Output --  Net 440 ml   Last Weight  Most recent update: 01/15/2021  4:46 AM    Weight  64.6 kg (142 lb 8 oz)            Gen:  AA F in moderate distress HEENT: moist mucous membranes CV: Irregular rate and rhythm  PULM: 4LPM [place], Tachypnea QQP:[id number] in place EXT: No edema Neuro: Alert and oriented x3, intermittently sleepy but appropriate  SUMMARY OF RECOMMENDATIONS   Full Code --> Family will continue to discuss this  Per Oncology not additional Chemo will be offered  Appreciate [facility] liaison coming by to speak to patient and her mother  Continue dilaudid and ativan PRN for symptom  burden   Ongoing conversations in the oncoming days  Discussed with Dr. Starla Link  Time In: 0700 Time Out: 0848 Time Spent: 108 Greater than 50% of the time was spent in counseling and coordination of care ______________________________________________________________________________________ [place] Team Team Cell Phone: [phone] Please utilize secure chat with additional questions, if there is no response within 30 minutes please call the above phone number  Palliative Medicine Team providers are available by phone from 7am to 7pm daily and can be reached through the team cell phone.  Should this patient require assistance outside of these hours, please call the patient's attending physician.

## 2021-01-16 LAB — CBC WITH DIFFERENTIAL/PLATELET
Abs Immature Granulocytes: 0.03 10*3/uL (ref 0.00–0.07)
Basophils Absolute: 0 10*3/uL (ref 0.0–0.1)
Basophils Relative: 0 %
Eosinophils Absolute: 0 10*3/uL (ref 0.0–0.5)
Eosinophils Relative: 1 %
HCT: 37.2 % (ref 36.0–46.0)
Hemoglobin: 11.2 g/dL — ABNORMAL LOW (ref 12.0–15.0)
Immature Granulocytes: 1 %
Lymphocytes Relative: 10 %
Lymphs Abs: 0.6 10*3/uL — ABNORMAL LOW (ref 0.7–4.0)
MCH: 27.5 pg (ref 26.0–34.0)
MCHC: 30.1 g/dL (ref 30.0–36.0)
MCV: 91.2 fL (ref 80.0–100.0)
Monocytes Absolute: 1 10*3/uL (ref 0.1–1.0)
Monocytes Relative: 18 %
Neutro Abs: 4 10*3/uL (ref 1.7–7.7)
Neutrophils Relative %: 70 %
Platelets: 284 10*3/uL (ref 150–400)
RBC: 4.08 MIL/uL (ref 3.87–5.11)
RDW: 15.3 % (ref 11.5–15.5)
WBC: 5.7 10*3/uL (ref 4.0–10.5)
nRBC: 0 % (ref 0.0–0.2)

## 2021-01-16 LAB — COMPREHENSIVE METABOLIC PANEL
ALT: 19 U/L (ref 0–44)
AST: 25 U/L (ref 15–41)
Albumin: 2.1 g/dL — ABNORMAL LOW (ref 3.5–5.0)
Alkaline Phosphatase: 65 U/L (ref 38–126)
Anion gap: 10 (ref 5–15)
BUN: <5 mg/dL — ABNORMAL LOW (ref 8–23)
CO2: 29 mmol/L (ref 22–32)
Calcium: 8.5 mg/dL — ABNORMAL LOW (ref 8.9–10.3)
Chloride: 98 mmol/L (ref 98–111)
Creatinine, Ser: 0.61 mg/dL (ref 0.44–1.00)
GFR, Estimated: >60 mL/min (ref >60)
Glucose, Bld: 90 mg/dL (ref 70–99)
Potassium: 3.5 mmol/L (ref 3.5–5.1)
Sodium: 137 mmol/L (ref 135–145)
Total Bilirubin: 0.8 mg/dL (ref 0.3–1.2)
Total Protein: 5.7 g/dL — ABNORMAL LOW (ref 6.5–8.1)

## 2021-01-16 LAB — MAGNESIUM: Magnesium: 1.8 mg/dL (ref 1.7–2.4)

## 2021-01-16 MED ORDER — OXYCODONE HCL ER 10 MG PO T12A
20.0000 mg | EXTENDED_RELEASE_TABLET | Freq: Two times a day (BID) | ORAL | Status: DC
  Administered 2021-01-16 (×2): 20 mg via ORAL
  Filled 2021-01-16 (×2): qty 2

## 2021-01-16 MED ORDER — LORAZEPAM 1 MG PO TABS
1.0000 mg | ORAL_TABLET | ORAL | Status: DC | PRN
  Administered 2021-01-16 – 2021-01-17 (×4): 1 mg via ORAL
  Filled 2021-01-16 (×4): qty 1

## 2021-01-16 MED ORDER — OXYCODONE HCL 5 MG PO TABS
5.0000 mg | ORAL_TABLET | ORAL | Status: DC | PRN
  Administered 2021-01-16 – 2021-01-17 (×5): 10 mg via ORAL
  Filled 2021-01-16 (×5): qty 2

## 2021-01-16 MED ORDER — HYDROMORPHONE HCL 1 MG/ML IJ SOLN
1.0000 mg | INTRAMUSCULAR | Status: DC | PRN
  Administered 2021-01-16: 1 mg via INTRAVENOUS
  Filled 2021-01-16: qty 1

## 2021-01-16 NOTE — TOC Initial Note (Signed)
Transition of Care [place] [facility]) - Initial/Assessment Note    Patient Details  Name: Chelsea Mcintosh MRN: [id number] Date of Birth: 1960/12/11  Transition [facility] CM/SW Contact:    Pollie Friar, RN Phone Number: 01/16/2021, 2:16 PM  Clinical Narrative:                 Plan is for patient to d/c home with hospice services through [facility] tomorrow. DME to be delivered to the home tomorrow.  Pt will need transport home. TOC following.  Expected Discharge Plan: Home w Hospice Care Barriers to Discharge: Equipment Delay   Patient Goals and CMS Choice   CMS Medicare.gov Compare Post Acute Care list provided to:: Patient Represenative (must comment) Choice offered to / list presented to : Parent  Expected Discharge Plan and Services Expected Discharge Plan: [place]   Discharge Planning Services: CM Consult Post Acute Care Choice: Hospice, Durable Medical Equipment Living arrangements for the past 2 months: [place] Agency: Hospice and [place] Date [place]: 01/15/21   Representative spoke with at [place]: [place]  Prior Living Arrangements/Services Living arrangements for the past 2 months: [place] Lives with:: Parents Patient language and need for interpreter reviewed:: Yes        Need for Family Participation in Patient Care: Yes (Comment) Care giver support system in place?: Yes (comment)   Criminal Activity/Legal Involvement Pertinent to Current Situation/Hospitalization: No - Comment as needed  Activities of Daily Living      Permission Sought/Granted                  Emotional Assessment           Psych Involvement: No (comment)  Admission diagnosis:  Acute respiratory failure with hypoxia ([place]) [J96.01] HCAP (healthcare-associated pneumonia) [J18.9] Secondary carcinoma of lung, 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

## 2021-01-16 NOTE — Progress Notes (Signed)
Palliative Medicine Inpatient Follow Up Note  HPI:  60 y.o. 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 Chest xray showed bilateral multifocal infiltrates- unclear if this is pneumonia or metastatic disease. She does not have an elevated WBC, procalcitonin and lactic acid were normal. Palliative medicine consulted for goals of care.    Today's Discussion (01/16/2021):  *Please note that this is a verbal dictation therefore any spelling or grammatical errors are due to the "[place] One" system interpretation.  Chart reviewed as were medical notes.  I met this morning at bedside with Chelsea Mcintosh. She was more lethargic, she endorses having had a "tough night". She expresses that it is hard to sleep sometimes with her ongoing back pain. Reviewed that it appears she has received nine doses of IV dilaudid in the last 24 hours (155m of oral morphine equivalents), I shared this indicates that her pain is severe and they we are not optimizing it. She is open to uKoreatrying long acting oxycodone to see if this has any effect.  From an anxiety perspective the increase in the frequency of ativan has offered some relief.  Chelsea Mcintosh I discussed her fears of the unknown moving forward. She shares again that her Mcintosh would struggle if she lost her. Support provided through therapeutic listening.  We were able to jointly call Chelsea Mcintosh, Chelsea Mcintosh I reviewed with Chelsea Mcintosh again the services provided by home hospice and home palliative care. We reviewed that presently Chelsea Mcintosh high symptom burden and I worry home palliative care would not be able to address her complex needs. Chelsea Mcintosh understood and is in agreement with the pursuit of home with hospice. She is not ready to change the Code status at this juncture.    Chelsea Mcintosh requests additional information on caregivers as she worries about her abilities to care for both [place] her husband who suffers from moderate dementia. I shared with her that I will [facility].   Questions and concerns addressed   Objective Assessment: Vital Signs Vitals:   01/16/21 0743 01/16/21 0945  BP: 125/82 125/86  Pulse: (!) 119 (!) 123  Resp: 16 16  Temp: 98.9 F (37.2 C) 98.7 F (37.1 C)  SpO2: 95% 98%    Intake/Output Summary (Last 24 hours) at 01/16/2021 1132 Last data filed at 01/15/2021 1406 Gross per 24 hour  Intake 100 ml  Output --  Net 100 ml    Last Weight  Most recent update: 01/15/2021  4:46 AM    Weight  64.6 kg (142 lb 8 oz)            Gen:  AA F in moderate distress HEENT: moist mucous membranes CV: Irregular rate and rhythm  PULM: 5LPM [place]  ADIY:MEBRAXENMin place EXT: No edema Neuro: Alert and oriented x2-3, intermittently sleepy but appropriate  SUMMARY OF RECOMMENDATIONS   Full Code --> Family will continue to discuss this, Hospice is aware  Per Oncology no additional Chemo will be offered  Will initiate oxycodone to further help with peaks and valleys of unremitting pain; Continue oxycodone 5-10 Q3H for moderate/severe pain and dilaudid 146mPO Q3H for severe breakthrough pain  Continue ativan 0.5-33m73mV/PO Q4H anxiety  Continue with bowel regiment senna 1 tab BID, Miralax 17g PO Qday PRN  Appreciate [facility]  meeting and speaking to family - Plan for discharge to home under hospice services when optimized  Discussed with Chelsea Mcintosh  Time In: 1015 Time Out:  1110 Time Spent:  55 Greater than 50% of the time was spent in counseling and coordination of care ______________________________________________________________________________________   [place] Palliative Medicine Team Team Cell Phone: [phone] Please utilize secure chat with additional questions, if there is  no response within 30 minutes please call the above phone number  Palliative Medicine Team providers are available by phone from 7am to 7pm daily and can be reached through the team cell phone.  Should this patient require assistance outside of these hours, please call the patient's attending physician.     

## 2021-01-16 NOTE — Progress Notes (Signed)
Patient ID: Chelsea Mcintosh, female   DOB: [birth year], 60 y.o.   MRN: [id number]  PROGRESS NOTE    JAKLYNN JANOTA  [id number] DOB: 14-Jan-1961 DOA: 01/12/2021 PCP: System, Provider Not In   Brief Narrative:   60 y.o. female with medical history significant for ovarian cancer with diffuse metastasis who was supposed to start hospice care in [place] and recently admitted at outside facility in [place] for possible pneumonia with subsequent discharge on oral Augmentin presented with worsening shortness of breath, chest and back pain.  She recently moved to [place].  On presentation, she was hypoxic with oxygen saturations in the 70s on room air.  She was started on supplemental oxygen.  Chest x-ray showed bilateral pulmonary infiltrates.  She was started on antibiotics.  Oncology/palliative care were consulted.  Assessment & Plan:   Multifocal pneumonia Acute respiratory failure with hypoxia -Patient was recently admitted at an outside facility in [place] for possible pneumonia and treated with antibiotics and discharged on Augmentin -Presented with worsening shortness of breath and chest x-ray showed multifocal pneumonia.  Continue Unasyn.  Procalcitonin still less than 0.1.  Cultures negative so far. -COVID and influenza test were negative on presentation. -Oxygen requirement is getting worse: Currently on 5 L oxygen via nasal cannula.  No improvement with antibiotics.  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  -Oncology recommended no further chemotherapy and recommended hospice.   -Palliative care following.  Patient still wants to be full code but is leaning towards going home with hospice.  Elevated troponin Moderate pericardial effusion -Most likely from  demand ischemia from above.  Initial troponin 31.  Repeat troponin 18.  Echo showed EF of >75% with moderate pericardial effusion.   -Cardiology evaluation appreciated and recommend repeat echo in 3 to 4 weeks.  DVT prophylaxis: Subcutaneous Lovenox Code Status: Full Family Communication: None at bedside Disposition Plan: Status is: Inpatient  Remains inpatient appropriate because:Inpatient level of care appropriate due to severity of illness  Dispo:  Patient From: Home  Planned Disposition: Home  Medically stable for discharge: No    Consultants: Oncology/palliative care/cardiology  Procedures: Echo as below  Antimicrobials: Unasyn   Subjective: Patient seen and examined at bedside.  Poor historian.  Denies any chest pain.  Still short of breath with even minimal exertion.  No overnight fever or vomiting reported.   Objective: Vitals:   01/16/21 0010 01/16/21 0130 01/16/21 0400 01/16/21 0743  BP: (!) 143/98 140/80 121/88 125/82  Pulse: (!) 110 (!) 114 (!) 115 (!) 119  Resp: '15 12 12 16  '$ Temp: 98.5 F (36.9 C) 98.2 F (36.8 C)  98.9 F (37.2 C)  TempSrc: Oral Oral  Axillary  SpO2: 95% 95% 96% 95%  Weight:      Height:        Intake/Output Summary (Last 24 hours) at 01/16/2021 0837 Last data filed at 01/15/2021 1406 Gross per 24 hour  Intake 100 ml  Output --  Net 100 ml    Filed Weights   01/13/21 0334 01/15/21 0410  Weight: 63.5 kg 64.6 kg    Examination:  General exam: Currently on 5 L oxygen via nasal cannula.  Looks chronically ill and deconditioned.   Respiratory system: Bilateral decreased breath sounds at bases with scattered crackles  cardiovascular  system: S1-S2 heard, tachycardic gastrointestinal system: Abdomen is distended, soft and nontender.  Ostomy bag present.  Normal bowel sounds heard  extremities: No clubbing; lower extremity edema present bilaterally Central nervous system: Still extremely slow to respond to questions.  Poor historian.   No focal neurological deficits.  Moving extremities  skin: No obvious petechiae/rashes  psychiatry: Flat affect.  Intermittently anxious  Data Reviewed: I have personally reviewed following labs and imaging studies  CBC: Recent Labs  Lab 01/12/21 2207 01/13/21 0344 01/14/21 0546 01/16/21 0456  WBC 7.5 7.7 5.5 5.7  NEUTROABS 6.1  --   --  4.0  HGB 14.2 12.3 12.1 11.2*  HCT 45.4 40.5 39.6 37.2  MCV 90.1 90.2 90.0 91.2  PLT 310 272 274 [id number]    Basic Metabolic Panel: Recent Labs  Lab 01/12/21 2207 01/13/21 0344 01/14/21 0546 01/16/21 0456  NA 135 135 136 137  K 3.9 3.8 3.7 3.5  CL 99 102 99 98  CO2 '25 23 28 29  '$ GLUCOSE 97 101* 98 90  BUN 5* <5* 5* <5*  CREATININE 0.60 0.60 0.58 0.61  CALCIUM 8.9 8.2* 8.7* 8.5*  MG  --   --   --  1.8    GFR: Estimated Creatinine Clearance: 66 mL/min (by C-G formula based on SCr of 0.61 mg/dL). Liver Function Tests: Recent Labs  Lab 01/12/21 2207 01/16/21 0456  AST 25 25  ALT 18 19  ALKPHOS 74 65  BILITOT 0.5 0.8  PROT 6.9 5.7*  ALBUMIN 2.6* 2.1*    No results for input(s): LIPASE, AMYLASE in the last 168 hours. No results for input(s): AMMONIA in the last 168 hours. Coagulation Profile: Recent Labs  Lab 01/12/21 2207  INR 1.2    Cardiac Enzymes: No results for input(s): CKTOTAL, CKMB, CKMBINDEX, TROPONINI in the last 168 hours. BNP (last 3 results) No results for input(s): PROBNP in the last 8760 hours. HbA1C: No results for input(s): HGBA1C in the last 72 hours. CBG: Recent Labs  Lab 01/14/21 1709  GLUCAP 91    Lipid Profile: No results for input(s): CHOL, HDL, LDLCALC, TRIG, CHOLHDL, LDLDIRECT in the last 72 hours. Thyroid Function Tests: No results for input(s): TSH, T4TOTAL, FREET4, T3FREE, THYROIDAB in the last 72 hours. Anemia Panel: No results for input(s): VITAMINB12, FOLATE, FERRITIN, TIBC, IRON, RETICCTPCT in the last 72 hours. Sepsis Labs: Recent Labs  Lab 01/12/21 2207 01/13/21 0344  01/14/21 0546 01/15/21 0441  PROCALCITON  --  <0.10 <0.10 <0.10  LATICACIDVEN 1.6  --   --   --      Recent Results (from the past 240 hour(s))  Blood culture (routine x 2)     Status: None (Preliminary result)   Collection Time: 01/12/21 10:07 PM   Specimen: BLOOD  Result Value Ref Range Status   Specimen Description BLOOD LEFT ANTECUBITAL  Final   Special Requests   Final    BOTTLES DRAWN AEROBIC AND ANAEROBIC Blood Culture adequate volume   Culture   Final    NO GROWTH 2 DAYS Performed at [facility], [address] [place], [place] [address]    Report Status PENDING  Incomplete  Blood culture (routine x 2)     Status: None (Preliminary result)   Collection Time: 01/12/21 10:07 PM   Specimen: BLOOD RIGHT ARM  Result Value Ref Range Status   Specimen Description BLOOD RIGHT ARM  Final   Special Requests   Final    BOTTLES DRAWN AEROBIC ONLY Blood Culture  adequate volume   Culture   Final    NO GROWTH 2 DAYS Performed at [facility], [place] [address], [place], [place] [address]    Report Status PENDING  Incomplete  Resp Panel by RT-PCR (Flu A&B, Covid) Nasopharyngeal Swab     Status: None   Collection Time: 01/13/21 12:38 AM   Specimen: Nasopharyngeal Swab; Nasopharyngeal(NP) swabs in vial transport medium  Result Value Ref Range Status   SARS Coronavirus 2 by RT PCR NEGATIVE NEGATIVE Final    Comment: (NOTE) SARS-CoV-2 target nucleic acids are NOT DETECTED.  The SARS-CoV-2 RNA is generally detectable in upper respiratory specimens during the acute phase of infection. The lowest concentration of SARS-CoV-2 viral copies this assay can detect is 138 copies/mL. A negative result does not preclude SARS-Cov-2 infection and should not be used as the sole basis for treatment or other patient management decisions. A negative result may occur with  improper specimen collection/handling, submission of specimen other than nasopharyngeal swab, presence of viral  mutation(s) within the areas targeted by this assay, and inadequate number of viral copies(<138 copies/mL). A negative result must be combined with clinical observations, patient history, and epidemiological information. The expected result is Negative.  Fact Sheet for Patients:  EntrepreneurPulse.com.au  Fact Sheet for Healthcare Providers:  IncredibleEmployment.be  This test is no t yet approved or cleared by the [place] FDA and  has been authorized for detection and/or diagnosis of SARS-CoV-2 by FDA under an Emergency Use Authorization (EUA). This EUA will remain  in effect (meaning this test can be used) for the duration of the COVID-19 declaration under Section 564(b)(1) of the Act, 21 U.S.C.section 360bbb-3(b)(1), unless the authorization is terminated  or revoked sooner.       Influenza A by PCR NEGATIVE NEGATIVE Final   Influenza B by PCR NEGATIVE NEGATIVE Final    Comment: (NOTE) The Xpert Xpress SARS-CoV-2/FLU/RSV plus assay is intended as an aid in the diagnosis of influenza from Nasopharyngeal swab specimens and should not be used as a sole basis for treatment. Nasal washings and aspirates are unacceptable for Xpert Xpress SARS-CoV-2/FLU/RSV testing.  Fact Sheet for Patients: EntrepreneurPulse.com.au  Fact Sheet for Healthcare Providers: IncredibleEmployment.be  This test is not yet approved or cleared by the [place] FDA and has been authorized for detection and/or diagnosis of SARS-CoV-2 by FDA under an Emergency Use Authorization (EUA). This EUA will remain in effect (meaning this test can be used) for the duration of the COVID-19 declaration under Section 564(b)(1) of the Act, 21 U.S.C. section 360bbb-3(b)(1), unless the authorization is terminated or revoked.  Performed at [facility], [place] [address] [place], [place] [address]   MRSA Next Gen by PCR, Nasal      Status: None   Collection Time: 01/14/21 10:22 PM   Specimen: Nasal Mucosa; Nasal Swab  Result Value Ref Range Status   MRSA by PCR Next Gen NOT DETECTED NOT DETECTED Final    Comment: (NOTE) The GeneXpert MRSA Assay (FDA approved for NASAL specimens only), is one component of a comprehensive MRSA colonization surveillance program. It is not intended to diagnose MRSA infection nor to guide or monitor treatment for MRSA infections. Test performance is not FDA approved in patients less than 6 years old. Performed at [facility], [place] [address], [place], [place] [address]           Radiology Studies: DG Swallowing [facility]  Result Date: 01/14/2021 Formatting of this result is  different from the original. Objective Swallowing Evaluation: Type of Study: MBS-Modified Barium Swallow Study  Patient Details Name: DECARLA LYBARGER MRN: [id number] Date of Birth: Apr 16, 1961 Today's Date: 01/14/2021 Time: SLP Start Time (ACUTE ONLY): 1305 -SLP Stop Time (ACUTE ONLY): 1323 SLP Time Calculation (min) (ACUTE ONLY): 18 min Past Medical History: Past Medical History: Diagnosis Date  Ovarian cancer ([place])  Past Surgical History: No past surgical history on file. HPI: Pt is a 60 y.o. female who presented to the ED with shortness of breath, and chest and back pain. CXR 7/27: marked severity bilateral multifocal infiltrates. Given the patient's history of stage IV ovarian cancer, sequelae associated with diffuse pulmonary metastasis cannot be  excluded. Mild right basilar atelectasis and/or infiltrate with a very small right pleural effusion. Palliative care has been consulted to help establish [place] since pt was receiving hospice care in [place]. PMH: ovarian cancer with diffuse metastasis previously under hospice care and recently admitted at outside facility in [place], but moved to [place] to be closer to family.  No data recorded Assessment / Plan / Recommendation CHL IP CLINICAL  IMPRESSIONS 01/14/2021 Clinical Impression Pt was seen in radiology suite for modified barium swallow study. Trials of puree solids, regular texture solids, a 63m barium tablet, and thin liquids via cup and straw were administered. Pt's oropharyngeal swallow mechanism was within functional limits. Oral phase was mildly prolonged, but pt stated that she practices mindful eating and therefore enjoys taking extra time and care to consider the textures and/or various flavors of foods while eating. Secondary swallows were due to mild oral residue, and pt's desire to completely clear the oral cavity but [facility] No instances of penetration or aspiration were demonstrated even when her swallow was challenged. It is recommended that a regular texture diet with thin liquids be continued at this time. Further skilled SLP services are not clinically indicated at this time. SLP Visit Diagnosis Dysphagia, unspecified (R13.10) Attention and concentration deficit following -- Frontal lobe and executive function deficit following -- Impact on safety and function --   CHL IP TREATMENT RECOMMENDATION 01/14/2021 Treatment Recommendations No treatment recommended at this time   No flowsheet data found. CHL IP DIET RECOMMENDATION 01/14/2021 SLP Diet Recommendations Regular solids;Thin liquid Liquid Administration via Cup;Straw Medication Administration Whole meds with liquid Compensations Slow rate;Small sips/bites Postural Changes Seated upright at 90 degrees   CHL IP OTHER RECOMMENDATIONS 01/14/2021 Recommended Consults -- Oral Care Recommendations Oral care BID Other Recommendations --   CHL IP FOLLOW UP RECOMMENDATIONS 01/14/2021 Follow up Recommendations None   CHL IP FREQUENCY AND DURATION 01/14/2021 Speech Therapy Frequency (ACUTE ONLY) min 2x/week Treatment Duration 2 weeks      CHL IP ORAL PHASE 01/14/2021 Oral Phase WFL Oral - Pudding Teaspoon -- Oral - Pudding Cup -- Oral - Honey Teaspoon -- Oral - Honey Cup -- Oral - Nectar  Teaspoon -- Oral - Nectar Cup -- Oral - Nectar Straw -- Oral - Thin Teaspoon -- Oral - Thin Cup -- Oral - Thin Straw -- Oral - Puree -- Oral - Mech Soft -- Oral - Regular -- Oral - Multi-Consistency -- Oral - Pill -- Oral Phase - Comment --  CHL IP PHARYNGEAL PHASE 01/14/2021 Pharyngeal Phase WFL Pharyngeal- Pudding Teaspoon -- Pharyngeal -- Pharyngeal- Pudding Cup -- Pharyngeal -- Pharyngeal- Honey Teaspoon -- Pharyngeal -- Pharyngeal- Honey Cup -- Pharyngeal -- Pharyngeal- Nectar Teaspoon -- Pharyngeal -- Pharyngeal- Nectar Cup -- Pharyngeal -- Pharyngeal- Nectar Straw -- Pharyngeal -- Pharyngeal- Thin Teaspoon --  Pharyngeal -- Pharyngeal- Thin Cup -- Pharyngeal -- Pharyngeal- Thin Straw -- Pharyngeal -- Pharyngeal- Puree -- Pharyngeal -- Pharyngeal- Mechanical Soft -- Pharyngeal -- Pharyngeal- Regular -- Pharyngeal -- Pharyngeal- Multi-consistency -- Pharyngeal -- Pharyngeal- Pill -- Pharyngeal -- Pharyngeal Comment --  CHL IP CERVICAL ESOPHAGEAL PHASE 01/14/2021 Cervical Esophageal Phase WFL Pudding Teaspoon -- Pudding Cup -- Honey Teaspoon -- Honey Cup -- Nectar Teaspoon -- Nectar Cup -- Nectar Straw -- Thin Teaspoon -- Thin Cup -- Thin Straw -- Puree -- Mechanical Soft -- Regular -- Multi-consistency -- Pill -- Cervical Esophageal Comment -- Shanika I. Hardin Negus, [place], [place] Office number [phone] Pager [phone] Horton Marshall 01/14/2021, 1:56 PM                   Scheduled Meds:  Chlorhexidine Gluconate Cloth  6 each Topical Daily   enoxaparin (LOVENOX) injection  40 mg Subcutaneous Q24H   senna-docusate  1 tablet Oral BID   Continuous Infusions:  ampicillin-sulbactam (UNASYN) IV 3 g (01/16/21 0107)          Aline August, MD [facility] 01/16/2021, 8:37 AM

## 2021-01-16 NOTE — Progress Notes (Signed)
Manufacturing engineer [facility]) Nurse Liaison Note     Spoke with pt's mother to confirm space is ready to receive DME tomorrow; she confirms that it is.    DME to be ordered: hospital bed, over bed table, a 3N1 bedside commode, and oxygen be delivered to the home.  Address verified as correct in the chart.  Pt's mother has been asked to call [facility] liaisons once DME is in the home.   Contact information given to pt's mother and questions answered at this time.    Thank you for the opportunity to participate in this patient's care.     Domenic Moras, BSN, RN [facility] Liaison [phone] (24h on call)

## 2021-01-17 MED ORDER — BENZONATATE 100 MG PO CAPS: 100.0000 mg | ORAL_CAPSULE | Freq: Three times a day (TID) | ORAL | 0 refills | Status: DC | PRN

## 2021-01-17 MED ORDER — POLYETHYLENE GLYCOL 3350 17 G PO PACK: 17.0000 g | PACK | Freq: Every day | ORAL | 0 refills | Status: DC | PRN

## 2021-01-17 MED ORDER — CLONAZEPAM 0.25 MG PO TBDP: 0.2500 mg | ORAL_TABLET | Freq: Three times a day (TID) | ORAL | 0 refills | Status: DC

## 2021-01-17 MED ORDER — OXYCODONE HCL ER 30 MG PO T12A: 30.0000 mg | EXTENDED_RELEASE_TABLET | Freq: Two times a day (BID) | ORAL | 0 refills | Status: DC

## 2021-01-17 MED ORDER — SENNOSIDES-DOCUSATE SODIUM 8.6-50 MG PO TABS: 1.0000 | ORAL_TABLET | Freq: Two times a day (BID) | ORAL | 0 refills | Status: DC

## 2021-01-17 MED ORDER — AMOXICILLIN-POT CLAVULANATE 875-125 MG PO TABS: 1.0000 | ORAL_TABLET | Freq: Two times a day (BID) | ORAL | 0 refills | Status: DC

## 2021-01-17 MED ORDER — HEPARIN SOD (PORK) LOCK FLUSH 100 UNIT/ML IV SOLN
500.0000 [IU] | INTRAVENOUS | Status: AC | PRN
  Administered 2021-01-17: 500 [IU]
  Filled 2021-01-17: qty 5

## 2021-01-17 MED ORDER — OXYCODONE HCL ER 15 MG PO T12A
30.0000 mg | EXTENDED_RELEASE_TABLET | Freq: Two times a day (BID) | ORAL | Status: DC
  Administered 2021-01-17: 30 mg via ORAL
  Filled 2021-01-17: qty 2

## 2021-01-17 MED ORDER — OXYCODONE HCL 5 MG PO TABS: 5.0000 mg | ORAL_TABLET | ORAL | 0 refills | Status: DC | PRN

## 2021-01-17 MED ORDER — LORAZEPAM 1 MG PO TABS: 1.0000 mg | ORAL_TABLET | ORAL | 0 refills | Status: DC | PRN

## 2021-01-17 MED ORDER — CLONAZEPAM 0.25 MG PO TBDP
0.2500 mg | ORAL_TABLET | Freq: Three times a day (TID) | ORAL | Status: DC
  Administered 2021-01-17: 0.25 mg via ORAL
  Filled 2021-01-17: qty 1

## 2021-01-17 NOTE — Discharge Summary (Signed)
Physician Discharge Summary  Chelsea Mcintosh MWU:[id number] DOB: [birth year] DOA: 01/12/2021  PCP: System, Provider Not In  Admit date: 01/12/2021 Discharge date: 01/17/2021  Admitted From: Home Disposition: Home with hospice  Recommendations for Outpatient Follow-up:  Follow up with home hospice at earliest [place]: Home with hospice Equipment/Devices: Supplemental oxygen via nasal cannula  Discharge Condition: Poor CODE STATUS: Patient wants to remain full code Diet recommendation: Heart healthy  Brief/Interim Summary: 60 y.o. female with medical history significant for ovarian cancer with diffuse metastasis who was supposed to start hospice care in [place] and recently admitted at outside facility in [place] for possible pneumonia with subsequent discharge on oral Augmentin presented with worsening shortness of breath, chest and back pain.  She recently moved to [place].  On presentation, she was hypoxic with oxygen saturations in the 70s on room air.  She was started on supplemental oxygen.  Chest x-ray showed bilateral pulmonary infiltrates.  She was started on antibiotics.  Oncology/palliative care were consulted.  Oncology recommended no further chemotherapy and recommended hospice.  Patient wants to remain full code despite multiple discussions along with palliative care discussions.  She is agreeable for discharge to home with hospice.  She will be discharged to home with hospice once arrangements are made.  Discharge Diagnoses:   Multifocal pneumonia Acute respiratory failure with hypoxia -Patient was recently admitted at an outside facility in [place] for possible pneumonia and treated with antibiotics and discharged on Augmentin -Presented with worsening shortness of breath and chest x-ray showed multifocal pneumonia.  Continue Unasyn.  Procalcitonin still less than 0.1.  Cultures negative so far. -COVID and influenza test were negative on  presentation. -Currently still on 5 L oxygen via nasal cannula.  No improvement with antibiotics.  Doubt that this is bacterial pneumonia and the infiltrates are probably from cancer.  Will need supplemental oxygen on discharge.   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  -Oncology recommended no further chemotherapy and recommended hospice.   -Palliative care following.  Patient wants to remain full code despite multiple discussions along with palliative care discussions.  She is agreeable for discharge to home with hospice.  She will be discharged to home with hospice once arrangements are made.  We will continue current pain medications and antianxiety medications upon discharge.   Elevated troponin Moderate pericardial effusion -Most likely from demand ischemia from above.  Initial troponin 31.  Repeat troponin 18.  Echo showed EF of >75% with moderate pericardial effusion.   -Cardiology evaluation appreciated and recommend repeat echo in 3 to 4 weeks.     Discharge Instructions  Discharge Instructions     Diet - low sodium heart healthy   Complete by: As directed    Increase activity slowly   Complete by: As directed       Allergies as of 01/17/2021       Reactions   Vancomycin Itching   Flagyl [metronidazole]    Pt states it made her "feel weird and nauseous"   Sulfasalazine    Severe burning sensation in the esophagus.   Tape    Burning sensation of the skin if left on too long        Medication List     STOP taking these medications    diazepam 5 MG tablet Commonly known as: VALIUM   docusate sodium 100 MG  capsule Commonly known as: COLACE   fentaNYL 25 MCG/HR Commonly known as: DURAGESIC   lidocaine-prilocaine cream Commonly known as: EMLA   oxyCODONE-acetaminophen 10-325 MG tablet Commonly known as: PERCOCET        TAKE these medications    amoxicillin-clavulanate 875-125 MG tablet Commonly known as: AUGMENTIN Take 1 tablet by mouth 2 (two) times daily for 2 days. Take for 7 days. Started 7/24   benzonatate 100 MG capsule Commonly known as: TESSALON Take 1 capsule (100 mg total) by mouth 3 (three) times daily as needed for cough.   budesonide-formoterol 80-4.5 MCG/ACT inhaler Commonly known as: SYMBICORT Inhale 2 puffs into the lungs 2 (two) times daily. For 30 days.   clonazePAM 0.25 MG disintegrating tablet Commonly known as: KLONOPIN Take 1 tablet (0.25 mg total) by mouth 3 (three) times daily.   diphenhydrAMINE 25 MG tablet Commonly known as: BENADRYL Take 25 mg by mouth every 6 (six) hours as needed for itching.   escitalopram 10 MG tablet Commonly known as: LEXAPRO Take 10 mg by mouth daily.   famotidine 20 MG tablet Commonly known as: PEPCID Take 20 mg by mouth daily.   guaifenesin 100 MG/5ML syrup Commonly known as: ROBITUSSIN Take 200 mg by mouth 4 (four) times daily as needed for cough.   hydrocerin Crea Apply 1 application topically daily as needed (dry skin).   ipratropium-albuterol 0.5-2.5 (3) MG/3ML Soln Commonly known as: DUONEB Take 3 mLs by nebulization every 6 (six) hours as needed (bronchospasms).   LORazepam 1 MG tablet Commonly known as: ATIVAN Take 1 tablet (1 mg total) by mouth every 4 (four) hours as needed for anxiety.   methocarbamol 500 MG tablet Commonly known as: ROBAXIN Take 500 mg by mouth at bedtime.   naloxone 4 MG/0.1ML Liqd nasal spray kit Commonly known as: NARCAN Place 1 spray into the nose daily as needed (narcotic overdose).   ondansetron 4 MG tablet Commonly known as: ZOFRAN Take 4 mg by mouth every 8 (eight) hours as needed for nausea or vomiting.   oxyCODONE 30 MG 12 hr tablet Take 1 tablet (30 mg total) by mouth every 12 (twelve) hours.   oxyCODONE 5 MG immediate release tablet Commonly known as: Oxy  IR/ROXICODONE Take 1 tablet (5 mg total) by mouth every 4 (four) hours as needed for moderate pain or severe pain.   polyethylene glycol 17 g packet Commonly known as: MIRALAX / GLYCOLAX Take 17 g by mouth daily as needed for mild constipation.   senna-docusate 8.6-50 MG tablet Commonly known as: Senokot-S Take 1 tablet by mouth 2 (two) times daily.   sucralfate 1 g tablet Commonly known as: CARAFATE Take 1 g by mouth daily as needed (acid reflux).   SUMAtriptan 50 MG tablet Commonly known as: IMITREX Take 50 mg by mouth every 2 (two) hours as needed for migraine. May repeat in 2 hours if headache persists or recurs.   Systane 0.4-0.3 % Soln Generic drug: Polyethyl Glycol-Propyl Glycol Apply 1 drop to eye daily. Both eyes.        Follow-up Information     Home hospice Follow up.   Why: At earliest convenience               Allergies  Allergen Reactions   Vancomycin Itching   Flagyl [Metronidazole]     Pt states it made her "feel weird and nauseous"   Sulfasalazine     Severe burning sensation in the esophagus.   Tape  Burning sensation of the skin if left on too long    Consultations: Oncology/palliative care/cardiology   Procedures/Studies: DG Chest 2 View  Result Date: 01/12/2021 CLINICAL DATA:  History of stage IV ovarian cancer presenting with shortness of breath. EXAM: CHEST - 2 VIEW COMPARISON:  None. FINDINGS: A right-sided venous Port-A-Cath is seen with its distal tip noted within the right atrium. This is approximately 2.5 cm distal to the junction of the superior vena cava and right atrium. Innumerable ill-defined multifocal opacities are seen scattered throughout both lungs. Mild atelectasis and/or infiltrate is also seen within the lateral aspect of the right lung base. A very small right pleural effusion is seen. No pneumothorax is identified. The heart size and mediastinal contours are within normal limits. The visualized skeletal structures  are unremarkable. IMPRESSION: 1. Findings which may represent marked severity bilateral multifocal infiltrates. Given the patient's history of stage IV ovarian cancer, sequelae associated with diffuse pulmonary metastasis cannot be excluded. 2. Mild right basilar atelectasis and/or infiltrate with a very small right pleural effusion. Electronically Signed   By: Virgina Norfolk M.D.   On: 01/12/2021 22:41   DG Swallowing Func-Speech Pathology  Result Date: 01/14/2021 Formatting of this result is different from the original. Objective Swallowing Evaluation: Type of Study: MBS-Modified Barium Swallow Study  Patient Details Name: Chelsea Mcintosh MRN: [id number] Date of Birth: Jan 04, 1961 Today's Date: 01/14/2021 Time: SLP Start Time (ACUTE ONLY): 1305 -SLP Stop Time (ACUTE ONLY): 1323 SLP Time Calculation (min) (ACUTE ONLY): 18 min Past Medical History: Past Medical History: Diagnosis Date  Ovarian cancer ([place])  Past Surgical History: No past surgical history on file. HPI: Pt is a 61 y.o. female who presented to the ED with shortness of breath, and chest and back pain. CXR 7/27: marked severity bilateral multifocal infiltrates. Given the patient's history of stage IV ovarian cancer, sequelae associated with diffuse pulmonary metastasis cannot be  excluded. Mild right basilar atelectasis and/or infiltrate with a very small right pleural effusion. Palliative care has been consulted to help establish [place] since pt was receiving hospice care in [place]. PMH: ovarian cancer with diffuse metastasis previously under hospice care and recently admitted at outside facility in [place], but moved to [place] to be closer to family.  No data recorded Assessment / Plan / Recommendation CHL IP CLINICAL IMPRESSIONS 01/14/2021 Clinical Impression Pt was seen in radiology suite for modified barium swallow study. Trials of puree solids, regular texture solids, a 62m barium tablet, and thin liquids via cup and straw were  administered. Pt's oropharyngeal swallow mechanism was within functional limits. Oral phase was mildly prolonged, but pt stated that she practices mindful eating and therefore enjoys taking extra time and care to consider the textures and/or various flavors of foods while eating. Secondary swallows were due to mild oral residue, and pt's desire to completely clear the oral cavity but [facility] No instances of penetration or aspiration were demonstrated even when her swallow was challenged. It is recommended that a regular texture diet with thin liquids be continued at this time. Further skilled SLP services are not clinically indicated at this time. SLP Visit Diagnosis Dysphagia, unspecified (R13.10) Attention and concentration deficit following -- Frontal lobe and executive function deficit following -- Impact on safety and function --   CHL IP TREATMENT RECOMMENDATION 01/14/2021 Treatment Recommendations No treatment recommended at this time   No flowsheet data found. CHL IP DIET RECOMMENDATION 01/14/2021 SLP Diet Recommendations Regular solids;Thin liquid Liquid Administration via Cup;Straw Medication Administration  Whole meds with liquid Compensations Slow rate;Small sips/bites Postural Changes Seated upright at 90 degrees   CHL IP OTHER RECOMMENDATIONS 01/14/2021 Recommended Consults -- Oral Care Recommendations Oral care BID Other Recommendations --   CHL IP FOLLOW UP RECOMMENDATIONS 01/14/2021 Follow up Recommendations None   CHL IP FREQUENCY AND DURATION 01/14/2021 Speech Therapy Frequency (ACUTE ONLY) min 2x/week Treatment Duration 2 weeks      CHL IP ORAL PHASE 01/14/2021 Oral Phase WFL Oral - Pudding Teaspoon -- Oral - Pudding Cup -- Oral - Honey Teaspoon -- Oral - Honey Cup -- Oral - Nectar Teaspoon -- Oral - Nectar Cup -- Oral - Nectar Straw -- Oral - Thin Teaspoon -- Oral - Thin Cup -- Oral - Thin Straw -- Oral - Puree -- Oral - Mech Soft -- Oral - Regular -- Oral - Multi-Consistency -- Oral - Pill --  Oral Phase - Comment --  CHL IP PHARYNGEAL PHASE 01/14/2021 Pharyngeal Phase WFL Pharyngeal- Pudding Teaspoon -- Pharyngeal -- Pharyngeal- Pudding Cup -- Pharyngeal -- Pharyngeal- Honey Teaspoon -- Pharyngeal -- Pharyngeal- Honey Cup -- Pharyngeal -- Pharyngeal- Nectar Teaspoon -- Pharyngeal -- Pharyngeal- Nectar Cup -- Pharyngeal -- Pharyngeal- Nectar Straw -- Pharyngeal -- Pharyngeal- Thin Teaspoon -- Pharyngeal -- Pharyngeal- Thin Cup -- Pharyngeal -- Pharyngeal- Thin Straw -- Pharyngeal -- Pharyngeal- Puree -- Pharyngeal -- Pharyngeal- Mechanical Soft -- Pharyngeal -- Pharyngeal- Regular -- Pharyngeal -- Pharyngeal- Multi-consistency -- Pharyngeal -- Pharyngeal- Pill -- Pharyngeal -- Pharyngeal Comment --  CHL IP CERVICAL ESOPHAGEAL PHASE 01/14/2021 Cervical Esophageal Phase WFL Pudding Teaspoon -- Pudding Cup -- Honey Teaspoon -- Honey Cup -- Nectar Teaspoon -- Nectar Cup -- Nectar Straw -- Thin Teaspoon -- Thin Cup -- Thin Straw -- Puree -- Mechanical Soft -- Regular -- Multi-consistency -- Pill -- Cervical Esophageal Comment -- Shanika I. Hardin Negus, [place], [place] Office number [phone] Pager [phone] [place] 01/14/2021, 1:56 PM              ECHOCARDIOGRAM COMPLETE  Result Date: 01/13/2021    ECHOCARDIOGRAM REPORT   Patient Name:   Chelsea Mcintosh Date of Exam: 01/13/2021 Medical Rec #:  [id number]         Height:       63.0 in Accession #:    [id number]        Weight:       140.0 lb Date of Birth:  Sep 07, 1960          BSA:          1.662 m Patient Age:    65 years          BP:           129/109 mmHg Patient Gender: F                 HR:           120 bpm. Exam Location:  Inpatient Procedure: 2D Echo, Cardiac Doppler and Color Doppler Indications:     Elevated troponin.; R07.9* Chest pain, unspecified; R06.02 SOB  History:         Patient has no prior history of Echocardiogram examinations.                  Signs/Symptoms:Chest Pain, Dyspnea and Shortness of  Breath.                  Cancer. Pneumonia.  Sonographer:     Roseanna Rainbow RDCS Referring Phys:  [id number] [place] Diagnosing  Phys: Eleonore Chiquito MD  Sonographer Comments: Technically difficult study due to poor echo windows. IMPRESSIONS  1. There is a small to moderate pericardial effusion (up to 0.9 cm) located near the apex of the LV/RV. There is no RV/RA collapse in diastole. The IVC is collapsing. There is no evidence of pericardial tamponade. Moderate pericardial effusion. The pericardial effusion is surrounding the apex. There is no evidence of cardiac tamponade.  2. Left ventricular ejection fraction, by estimation, is >75%. The left ventricle has hyperdynamic function. The left ventricle has no regional wall motion abnormalities. There is mild asymmetric left ventricular hypertrophy of the basal-septal segment.  Indeterminate diastolic filling due to E-A fusion.  3. Right ventricular systolic function is hyperdynamic. The right ventricular size is normal. There is mildly elevated pulmonary artery systolic pressure. The estimated right ventricular systolic pressure is 12.7 mmHg.  4. The mitral valve is grossly normal. No evidence of mitral valve regurgitation. No evidence of mitral stenosis.  5. The aortic valve is tricuspid. Aortic valve regurgitation is not visualized. No aortic stenosis is present.  6. The inferior vena cava is normal in size with greater than 50% respiratory variability, suggesting right atrial pressure of 3 mmHg. Comparison(s): No prior Echocardiogram. FINDINGS  Left Ventricle: Left ventricular ejection fraction, by estimation, is >75%. The left ventricle has hyperdynamic function. The left ventricle has no regional wall motion abnormalities. The left ventricular internal cavity size was normal in size. There is mild asymmetric left ventricular hypertrophy of the basal-septal segment. Indeterminate diastolic filling due to E-A fusion. Right Ventricle: The right ventricular size is  normal. No increase in right ventricular wall thickness. Right ventricular systolic function is hyperdynamic. There is mildly elevated pulmonary artery systolic pressure. The tricuspid regurgitant velocity is 3.07 m/s, and with an assumed right atrial pressure of 3 mmHg, the estimated right ventricular systolic pressure is 51.7 mmHg. Left Atrium: Left atrial size was normal in size. Right Atrium: Right atrial size was normal in size. Pericardium: There is a small to moderate pericardial effusion (up to 0.9 cm) located near the apex of the LV/RV. There is no RV/RA collapse in diastole. The IVC is collapsing. There is no evidence of pericardial tamponade. A moderately sized pericardial  effusion is present. The pericardial effusion is surrounding the apex. There is no evidence of cardiac tamponade. Mitral Valve: The mitral valve is grossly normal. No evidence of mitral valve regurgitation. No evidence of mitral valve stenosis. Tricuspid Valve: The tricuspid valve is grossly normal. Tricuspid valve regurgitation is trivial. No evidence of tricuspid stenosis. Aortic Valve: The aortic valve is tricuspid. Aortic valve regurgitation is not visualized. No aortic stenosis is present. Pulmonic Valve: The pulmonic valve was grossly normal. Pulmonic valve regurgitation is not visualized. No evidence of pulmonic stenosis. Aorta: The aortic root and ascending aorta are structurally normal, with no evidence of dilitation. Venous: The inferior vena cava is normal in size with greater than 50% respiratory variability, suggesting right atrial pressure of 3 mmHg. IAS/Shunts: The atrial septum is grossly normal. Additional Comments: There is a small pleural effusion in the left lateral region.  LEFT VENTRICLE PLAX 2D LVIDd:         3.10 cm     Diastology LVIDs:         2.00 cm     LV e' medial:    6.09 cm/s LV PW:         0.90 cm     LV E/e' medial:  8.5 LV IVS:  1.20 cm     LV e' lateral:   10.70 cm/s LVOT diam:     1.90 cm      LV E/e' lateral: 4.8 LV SV:         36 LV SV Index:   22 LVOT Area:     2.84 cm  LV Volumes (MOD) LV vol d, MOD A2C: 32.3 ml LV vol d, MOD A4C: 27.3 ml LV vol s, MOD A2C: 9.0 ml LV vol s, MOD A4C: 12.0 ml LV SV MOD A2C:     23.3 ml LV SV MOD A4C:     27.3 ml LV SV MOD BP:      24.9 ml RIGHT VENTRICLE            IVC RV S prime:     9.66 cm/s  IVC diam: 1.90 cm TAPSE (M-mode): 2.5 cm LEFT ATRIUM             Index      RIGHT ATRIUM          Index LA diam:        3.20 cm 1.93 cm/m RA Area:     8.20 cm LA Vol (A2C):   9.0 ml  5.39 ml/m RA Volume:   15.60 ml 9.39 ml/m LA Vol (A4C):   8.7 ml  5.25 ml/m LA Biplane Vol: 8.8 ml  5.31 ml/m  AORTIC VALVE LVOT Vmax:   97.40 cm/s LVOT Vmean:  61.800 cm/s LVOT VTI:    0.128 m  AORTA Ao Root diam: 3.20 cm Ao Asc diam:  3.30 cm MITRAL VALVE               TRICUSPID VALVE MV Area (PHT): 6.71 cm    TR Peak grad:   37.7 mmHg MV Decel Time: 113 msec    TR Vmax:        307.00 cm/s MV E velocity: 51.80 cm/s MV A velocity: 80.60 cm/s  SHUNTS MV E/A ratio:  0.64        Systemic VTI:  0.13 m                            Systemic Diam: 1.90 cm Eleonore Chiquito MD Electronically signed by Eleonore Chiquito MD Signature Date/Time: 01/13/2021/1:25:04 PM    Final (Updated)       Subjective: Patient seen at bedside.  As per palliative care team, patient is agreeable to go home today.  Patient refused to talk to me today.  Discharge Exam: Vitals:   01/17/21 0742 01/17/21 0800  BP: (!) 120/92 (!) 132/107  Pulse: (!) 119 (!) 117  Resp: 20 (!) 9  Temp: 98.5 F (36.9 C)   SpO2: 97% 90%    General: Chronically ill looking female lying in bed.  Looks anxious.  On 5 L oxygen via nasal cannula.    The results of significant diagnostics from this hospitalization (including imaging, microbiology, ancillary and laboratory) are listed below for reference.     Microbiology: Recent Results (from the past 240 hour(s))  Blood culture (routine x 2)     Status: None (Preliminary result)    Collection Time: 01/12/21 10:07 PM   Specimen: BLOOD  Result Value Ref Range Status   Specimen Description BLOOD LEFT ANTECUBITAL  Final   Special Requests   Final    BOTTLES DRAWN AEROBIC AND ANAEROBIC Blood Culture adequate volume   Culture   Final    NO GROWTH  4 DAYS Performed at [facility], [place] [address] [place], [place] [address]    Report Status PENDING  Incomplete  Blood culture (routine x 2)     Status: None (Preliminary result)   Collection Time: 01/12/21 10:07 PM   Specimen: BLOOD RIGHT ARM  Result Value Ref Range Status   Specimen Description BLOOD RIGHT ARM  Final   Special Requests   Final    BOTTLES DRAWN AEROBIC ONLY Blood Culture adequate volume   Culture   Final    NO GROWTH 4 DAYS Performed at [facility], [place] [address], [place], [place] [address]    Report Status PENDING  Incomplete  Resp Panel by RT-PCR (Flu A&B, Covid) Nasopharyngeal Swab     Status: None   Collection Time: 01/13/21 12:38 AM   Specimen: Nasopharyngeal Swab; Nasopharyngeal(NP) swabs in vial transport medium  Result Value Ref Range Status   SARS Coronavirus 2 by RT PCR NEGATIVE NEGATIVE Final    Comment: (NOTE) SARS-CoV-2 target nucleic acids are NOT DETECTED.  The SARS-CoV-2 RNA is generally detectable in upper respiratory specimens during the acute phase of infection. The lowest concentration of SARS-CoV-2 viral copies this assay can detect is 138 copies/mL. A negative result does not preclude SARS-Cov-2 infection and should not be used as the sole basis for treatment or other patient management decisions. A negative result may occur with  improper specimen collection/handling, submission of specimen other than nasopharyngeal swab, presence of viral mutation(s) within the areas targeted by this assay, and inadequate number of viral copies(<138 copies/mL). A negative result must be combined with clinical observations, patient history, and epidemiological information. The  expected result is Negative.  Fact Sheet for Patients:  EntrepreneurPulse.com.au  Fact Sheet for Healthcare Providers:  IncredibleEmployment.be  This test is no t yet approved or cleared by the [place] FDA and  has been authorized for detection and/or diagnosis of SARS-CoV-2 by FDA under an Emergency Use Authorization (EUA). This EUA will remain  in effect (meaning this test can be used) for the duration of the COVID-19 declaration under Section 564(b)(1) of the Act, 21 U.S.C.section 360bbb-3(b)(1), unless the authorization is terminated  or revoked sooner.       Influenza A by PCR NEGATIVE NEGATIVE Final   Influenza B by PCR NEGATIVE NEGATIVE Final    Comment: (NOTE) The Xpert Xpress SARS-CoV-2/FLU/RSV plus assay is intended as an aid in the diagnosis of influenza from Nasopharyngeal swab specimens and should not be used as a sole basis for treatment. Nasal washings and aspirates are unacceptable for Xpert Xpress SARS-CoV-2/FLU/RSV testing.  Fact Sheet for Patients: EntrepreneurPulse.com.au  Fact Sheet for Healthcare Providers: IncredibleEmployment.be  This test is not yet approved or cleared by the [place] FDA and has been authorized for detection and/or diagnosis of SARS-CoV-2 by FDA under an Emergency Use Authorization (EUA). This EUA will remain in effect (meaning this test can be used) for the duration of the COVID-19 declaration under Section 564(b)(1) of the Act, 21 U.S.C. section 360bbb-3(b)(1), unless the authorization is terminated or revoked.  Performed at [facility], [place] [address] [place], [place] [address]   MRSA Next Gen by PCR, Nasal     Status: None   Collection Time: 01/14/21 10:22 PM   Specimen: Nasal Mucosa; Nasal Swab  Result Value Ref Range Status   MRSA by PCR Next Gen NOT DETECTED NOT DETECTED Final    Comment: (NOTE) The GeneXpert MRSA Assay (FDA  approved for  NASAL specimens only), is one component of a comprehensive MRSA colonization surveillance program. It is not intended to diagnose MRSA infection nor to guide or monitor treatment for MRSA infections. Test performance is not FDA approved in patients less than 3 years old. Performed at [facility], [place] [address] [place], [place] [address]      Labs: BNP (last 3 results) Recent Labs    01/12/21 2207  BNP 47.4   Basic Metabolic Panel: Recent Labs  Lab 01/12/21 2207 01/13/21 0344 01/14/21 0546 01/16/21 0456  NA 135 135 136 137  K 3.9 3.8 3.7 3.5  CL 99 102 99 98  CO2 25 23 28 29   GLUCOSE 97 101* 98 90  BUN 5* <5* 5* <5*  CREATININE 0.60 0.60 0.58 0.61  CALCIUM 8.9 8.2* 8.7* 8.5*  MG  --   --   --  1.8   Liver Function Tests: Recent Labs  Lab 01/12/21 2207 01/16/21 0456  AST 25 25  ALT 18 19  ALKPHOS 74 65  BILITOT 0.5 0.8  PROT 6.9 5.7*  ALBUMIN 2.6* 2.1*   No results for input(s): LIPASE, AMYLASE in the last 168 hours. No results for input(s): AMMONIA in the last 168 hours. CBC: Recent Labs  Lab 01/12/21 2207 01/13/21 0344 01/14/21 0546 01/16/21 0456  WBC 7.5 7.7 5.5 5.7  NEUTROABS 6.1  --   --  4.0  HGB 14.2 12.3 12.1 11.2*  HCT 45.4 40.5 39.6 37.2  MCV 90.1 90.2 90.0 91.2  PLT 310 272 274 284   Cardiac Enzymes: No results for input(s): CKTOTAL, CKMB, CKMBINDEX, TROPONINI in the last 168 hours. BNP: Invalid input(s): POCBNP CBG: Recent Labs  Lab 01/14/21 1709  GLUCAP 91   D-Dimer No results for input(s): DDIMER in the last 72 hours. Hgb A1c No results for input(s): HGBA1C in the last 72 hours. Lipid Profile No results for input(s): CHOL, HDL, LDLCALC, TRIG, CHOLHDL, LDLDIRECT in the last 72 hours. Thyroid function studies No results for input(s): TSH, T4TOTAL, T3FREE, THYROIDAB in the last 72 hours.  Invalid input(s): FREET3 Anemia work up No results for input(s): VITAMINB12, FOLATE, FERRITIN, TIBC, IRON,  RETICCTPCT in the last 72 hours. Urinalysis No results found for: COLORURINE, APPEARANCEUR, [place], [place], [place], [place], [place], [place], PROTEINUR, UROBILINOGEN, NITRITE, LEUKOCYTESUR Sepsis Labs Invalid input(s): PROCALCITONIN,  WBC,  LACTICIDVEN Microbiology Recent Results (from the past 240 hour(s))  Blood culture (routine x 2)     Status: None (Preliminary result)   Collection Time: 01/12/21 10:07 PM   Specimen: BLOOD  Result Value Ref Range Status   Specimen Description BLOOD LEFT ANTECUBITAL  Final   Special Requests   Final    BOTTLES DRAWN AEROBIC AND ANAEROBIC Blood Culture adequate volume   Culture   Final    NO GROWTH 4 DAYS Performed at [facility], [place] [address] [place], [place] [address]    Report Status PENDING  Incomplete  Blood culture (routine x 2)     Status: None (Preliminary result)   Collection Time: 01/12/21 10:07 PM   Specimen: BLOOD RIGHT ARM  Result Value Ref Range Status   Specimen Description BLOOD RIGHT ARM  Final   Special Requests   Final    BOTTLES DRAWN AEROBIC ONLY Blood Culture adequate volume   Culture   Final    NO GROWTH 4 DAYS Performed at [facility], [place] [address], [place], [place] [address]    Report Status PENDING  Incomplete  Resp Panel by RT-PCR (Flu A&B, Covid) Nasopharyngeal Swab     Status: None   Collection Time: 01/13/21 12:38 AM   Specimen: Nasopharyngeal Swab; Nasopharyngeal(NP) swabs in vial transport medium  Result Value Ref Range Status   SARS Coronavirus 2 by RT PCR NEGATIVE NEGATIVE Final    Comment: (NOTE) SARS-CoV-2 target nucleic acids are NOT DETECTED.  The SARS-CoV-2 RNA is generally detectable in upper respiratory specimens during the acute phase of infection. The lowest concentration of SARS-CoV-2 viral copies this assay can detect is 138 copies/mL. A negative result does not preclude SARS-Cov-2 infection and should not be used as the sole basis for treatment or other patient  management decisions. A negative result may occur with  improper specimen collection/handling, submission of specimen other than nasopharyngeal swab, presence of viral mutation(s) within the areas targeted by this assay, and inadequate number of viral copies(<138 copies/mL). A negative result must be combined with clinical observations, patient history, and epidemiological information. The expected result is Negative.  Fact Sheet for Patients:  EntrepreneurPulse.com.au  Fact Sheet for Healthcare Providers:  IncredibleEmployment.be  This test is no t yet approved or cleared by the [place] FDA and  has been authorized for detection and/or diagnosis of SARS-CoV-2 by FDA under an Emergency Use Authorization (EUA). This EUA will remain  in effect (meaning this test can be used) for the duration of the COVID-19 declaration under Section 564(b)(1) of the Act, 21 U.S.C.section 360bbb-3(b)(1), unless the authorization is terminated  or revoked sooner.       Influenza A by PCR NEGATIVE NEGATIVE Final   Influenza B by PCR NEGATIVE NEGATIVE Final    Comment: (NOTE) The Xpert Xpress SARS-CoV-2/FLU/RSV plus assay is intended as an aid in the diagnosis of influenza from Nasopharyngeal swab specimens and should not be used as a sole basis for treatment. Nasal washings and aspirates are unacceptable for Xpert Xpress SARS-CoV-2/FLU/RSV testing.  Fact Sheet for Patients: EntrepreneurPulse.com.au  Fact Sheet for Healthcare Providers: IncredibleEmployment.be  This test is not yet approved or cleared by the [place] FDA and has been authorized for detection and/or diagnosis of SARS-CoV-2 by FDA under an Emergency Use Authorization (EUA). This EUA will remain in effect (meaning this test can be used) for the duration of the COVID-19 declaration under Section 564(b)(1) of the Act, 21 U.S.C. section 360bbb-3(b)(1),  unless the authorization is terminated or revoked.  Performed at [facility], [place] [address] [place], [place] [address]   MRSA Next Gen by PCR, Nasal     Status: None   Collection Time: 01/14/21 10:22 PM   Specimen: Nasal Mucosa; Nasal Swab  Result Value Ref Range Status   MRSA by PCR Next Gen NOT DETECTED NOT DETECTED Final    Comment: (NOTE) The GeneXpert MRSA Assay (FDA approved for NASAL specimens only), is one component of a comprehensive MRSA colonization surveillance program. It is not intended to diagnose MRSA infection nor to guide or monitor treatment for MRSA infections. Test performance is not FDA approved in patients less than 47 years old. Performed at [facility], [place] [address] [place], [place] [address]      Time coordinating discharge: 35 minutes  SIGNED:   Aline August, MD  [facility] 01/17/2021, 11:50 AM

## 2021-01-17 NOTE — TOC Transition Note (Signed)
Transition of Care [facility]) - CM/SW Discharge Note   Patient Details  Name: Chelsea Mcintosh MRN: [id number] Date of Birth: 1960-07-19  Transition of Care [facility]) CM/SW Contact:  Bartholomew Crews, RN Phone Number: [phone] 01/17/2021, 1:04 PM   Clinical Narrative:     Patient ready to transition home with hospice care ([facility]). Confirmed delivery of DME this morning. PTAR arranged for 1 pm pick up. Spoke with patient's mom, Chelsea Mcintosh, by phone. Advised of comfort medications called in - Chelsea Mcintosh requested prescriptions be transferred to [place]. MD notified - transfer processed. Medical transport paperwork completed. [facility] liaison notified of [place] arrangements. No further TOC needs identified at this time.  Final next level of care: Home w Hospice Care Barriers to Discharge: No Barriers Identified   Patient Goals and CMS Choice Patient states their goals for this hospitalization and ongoing recovery are:: home with [facility] CMS Medicare.gov Compare Post Acute Care list provided to:: Patient Represenative (must comment) (mother, Chelsea Mcintosh) Choice offered to / list presented to : Parent  Discharge Placement                       Discharge Plan and Services   Discharge Planning Services: CM Consult Post Acute Care Choice: Hospice, [place] Agency: Hospice and [place] Date [place]: 01/15/21   Representative spoke with at [place]: [place] ([place]) Interventions     Readmission Risk Interventions No flowsheet data found.

## 2021-01-17 NOTE — Progress Notes (Signed)
D/c education provided to Mother. She has no questions at this time. Pt has no questions.

## 2021-01-17 NOTE — Progress Notes (Signed)
   Palliative Medicine Inpatient Follow Up Note  HPI:  60 y.o. 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 Chest xray showed bilateral multifocal infiltrates- unclear if this is pneumonia or metastatic disease. She does not have an elevated WBC, procalcitonin and lactic acid were normal. Palliative medicine consulted for goals of care.    Today's Discussion (01/17/2021):  *Please note that this is a verbal dictation therefore any spelling or grammatical errors are due to the "[place] One" system interpretation.  Chart reviewed as were medical notes.  I met at bedside this morning with Chelsea Mcintosh. We reviewed her pain regiment, she shares improvements with the oxycodone. We discussed that she appears per chart review to have better control of pain though she still is have acute bouts of discomfort. We reviewed increased the dose again today, ideally to achieve more optimal control.  Chelsea Mcintosh is far less anxious this morning. It appears that the lorazepam is working though being used fairly frequently. Reviewed placing on clonazepam TID to gain more prolonged control of anxiety.  Chelsea Mcintosh did experience a bowel movements yesterday.  The plan presently will be for Chelsea Mcintosh to go home under hospice care. The delivery of DME should take place today. Patient is anxious to leave today if possible.  Questions and concerns addressed   Objective Assessment: Vital Signs Vitals:   01/17/21 0400 01/17/21 0742  BP: 127/81 (!) 120/92  Pulse: (!) 119 (!) 119  Resp: 16 20  Temp: 98.2 F (36.8 C) 98.5 F (36.9 C)  SpO2: 93% 97%   No intake or output data in the 24 hours ending 01/17/21 0745  Last Weight  Most recent update: 01/15/2021  4:46 AM    Weight  64.6 kg (142 lb 8 oz)            Gen:  AA F in moderate  distress HEENT: moist mucous membranes CV: Irregular rate and rhythm  PULM: 5LPM [place]  [id number] in place EXT: No edema Neuro: Alert and oriented x2-3, intermittently sleepy but appropriate  SUMMARY OF RECOMMENDATIONS   Full Code --> Family will continue to discuss this, Hospice is aware  Per Oncology no additional Chemo will be offered  Metastatic Pain: Oxycodone 30mg  PO Qday, Continue oxycodone 5-10 Q3H for moderate/severe pain and dilaudid 1mg  PO Q3H for severe breakthrough pain  Anxiety: Initiate clonazepam 0.25mg  PO TID, Continue ativan  1mg   O Q4H anxiety  Continue with bowel regiment senna 1 tab BID, Miralax 17g PO Qday PRN  Appreciate [facility] meeting and speaking to family - Plan for discharge to home under hospice services when optimized  Discussed with Dr. Starla Link  Time Spent:  35 Greater than 50% of the time was spent in counseling and coordination of care ______________________________________________________________________________________ [place] Team Team Cell Phone: [phone] Please utilize secure chat with additional questions, if there is no response within 30 minutes please call the above phone number  Palliative Medicine Team providers are available by phone from 7am to 7pm daily and can be reached through the team cell phone.  Should this patient require assistance outside of these hours, please call the patient's attending physician.

## 2021-01-17 NOTE — Care Management Important Message (Signed)
Important Message  Patient Details  Name: Chelsea Mcintosh MRN: [id number] Date of Birth: [birth year]   Medicare Important Message Given:  Yes     Orbie Pyo 01/17/2021, 4:48 PM

## 2021-01-18 ENCOUNTER — Emergency Department (HOSPITAL_COMMUNITY): Payer: Medicare Other

## 2021-01-18 ENCOUNTER — Other Ambulatory Visit: Payer: Self-pay

## 2021-01-18 ENCOUNTER — Encounter (HOSPITAL_COMMUNITY): Payer: Self-pay | Admitting: Emergency Medicine

## 2021-01-18 ENCOUNTER — Inpatient Hospital Stay (HOSPITAL_COMMUNITY)
Admission: EM | Admit: 2021-01-18 | Discharge: 2021-01-27 | DRG: 951 | Disposition: A | Discharge status: Hospice | Payer: Medicare Other | Attending: Internal Medicine | Admitting: Internal Medicine

## 2021-01-18 DIAGNOSIS — R000 Tachycardia, unspecified: Secondary | ICD-10-CM | POA: Diagnosis present

## 2021-01-18 DIAGNOSIS — K5903 Drug induced constipation: Secondary | ICD-10-CM | POA: Diagnosis present

## 2021-01-18 DIAGNOSIS — G893 Neoplasm related pain (acute) (chronic): Secondary | ICD-10-CM | POA: Diagnosis present

## 2021-01-18 DIAGNOSIS — Z515 Encounter for palliative care: Secondary | ICD-10-CM | POA: Diagnosis not present

## 2021-01-18 DIAGNOSIS — C569 Malignant neoplasm of unspecified ovary: Secondary | ICD-10-CM | POA: Diagnosis present

## 2021-01-18 DIAGNOSIS — R451 Restlessness and agitation: Secondary | ICD-10-CM | POA: Diagnosis not present

## 2021-01-18 DIAGNOSIS — C799 Secondary malignant neoplasm of unspecified site: Secondary | ICD-10-CM | POA: Diagnosis not present

## 2021-01-18 DIAGNOSIS — Z881 Allergy status to other antibiotic agents status: Secondary | ICD-10-CM | POA: Diagnosis not present

## 2021-01-18 DIAGNOSIS — Z885 Allergy status to narcotic agent status: Secondary | ICD-10-CM

## 2021-01-18 DIAGNOSIS — Z7951 Long term (current) use of inhaled steroids: Secondary | ICD-10-CM

## 2021-01-18 DIAGNOSIS — Z79899 Other long term (current) drug therapy: Secondary | ICD-10-CM | POA: Diagnosis not present

## 2021-01-18 DIAGNOSIS — Z888 Allergy status to other drugs, medicaments and biological substances status: Secondary | ICD-10-CM | POA: Diagnosis not present

## 2021-01-18 DIAGNOSIS — Z66 Do not resuscitate: Secondary | ICD-10-CM | POA: Diagnosis not present

## 2021-01-18 DIAGNOSIS — T402X5A Adverse effect of other opioids, initial encounter: Secondary | ICD-10-CM | POA: Diagnosis present

## 2021-01-18 DIAGNOSIS — J80 Acute respiratory distress syndrome: Secondary | ICD-10-CM | POA: Diagnosis present

## 2021-01-18 DIAGNOSIS — R0902 Hypoxemia: Secondary | ICD-10-CM | POA: Diagnosis not present

## 2021-01-18 DIAGNOSIS — F419 Anxiety disorder, unspecified: Secondary | ICD-10-CM | POA: Diagnosis present

## 2021-01-18 DIAGNOSIS — C7800 Secondary malignant neoplasm of unspecified lung: Secondary | ICD-10-CM | POA: Diagnosis present

## 2021-01-18 DIAGNOSIS — K5090 Crohn's disease, unspecified, without complications: Secondary | ICD-10-CM | POA: Diagnosis present

## 2021-01-18 DIAGNOSIS — J9601 Acute respiratory failure with hypoxia: Secondary | ICD-10-CM | POA: Diagnosis present

## 2021-01-18 DIAGNOSIS — Z8701 Personal history of pneumonia (recurrent): Secondary | ICD-10-CM | POA: Diagnosis not present

## 2021-01-18 DIAGNOSIS — Z20822 Contact with and (suspected) exposure to covid-19: Secondary | ICD-10-CM | POA: Diagnosis present

## 2021-01-18 DIAGNOSIS — Z79891 Long term (current) use of opiate analgesic: Secondary | ICD-10-CM | POA: Diagnosis not present

## 2021-01-18 DIAGNOSIS — J9621 Acute and chronic respiratory failure with hypoxia: Secondary | ICD-10-CM

## 2021-01-18 DIAGNOSIS — Z7189 Other specified counseling: Secondary | ICD-10-CM | POA: Diagnosis not present

## 2021-01-18 DIAGNOSIS — J441 Chronic obstructive pulmonary disease with (acute) exacerbation: Secondary | ICD-10-CM | POA: Diagnosis present

## 2021-01-18 DIAGNOSIS — R0602 Shortness of breath: Secondary | ICD-10-CM | POA: Diagnosis present

## 2021-01-18 LAB — CBC
HCT: 39.6 % (ref 36.0–46.0)
Hemoglobin: 12 g/dL (ref 12.0–15.0)
MCH: 27.5 pg (ref 26.0–34.0)
MCHC: 30.3 g/dL (ref 30.0–36.0)
MCV: 90.8 fL (ref 80.0–100.0)
Platelets: 273 10*3/uL (ref 150–400)
RBC: 4.36 MIL/uL (ref 3.87–5.11)
RDW: 15.2 % (ref 11.5–15.5)
WBC: 8.4 10*3/uL (ref 4.0–10.5)
nRBC: 0 % (ref 0.0–0.2)

## 2021-01-18 LAB — CULTURE, BLOOD (ROUTINE X 2)
Culture: NO GROWTH
Culture: NO GROWTH
Special Requests: ADEQUATE
Special Requests: ADEQUATE

## 2021-01-18 LAB — BASIC METABOLIC PANEL
Anion gap: 11 (ref 5–15)
BUN: 6 mg/dL — ABNORMAL LOW (ref 8–23)
CO2: 30 mmol/L (ref 22–32)
Calcium: 8.7 mg/dL — ABNORMAL LOW (ref 8.9–10.3)
Chloride: 96 mmol/L — ABNORMAL LOW (ref 98–111)
Creatinine, Ser: 0.41 mg/dL — ABNORMAL LOW (ref 0.44–1.00)
GFR, Estimated: >60 mL/min (ref >60)
Glucose, Bld: 113 mg/dL — ABNORMAL HIGH (ref 70–99)
Potassium: 3.3 mmol/L — ABNORMAL LOW (ref 3.5–5.1)
Sodium: 137 mmol/L (ref 135–145)

## 2021-01-18 LAB — BLOOD GAS, ARTERIAL
Acid-base deficit: 0.7 mmol/L (ref 0.0–2.0)
Bicarbonate: 26 mmol/L (ref 20.0–28.0)
FIO2: 50
O2 Saturation: 92.2 %
Patient temperature: 37
pCO2 arterial: 54.5 mmHg — ABNORMAL HIGH (ref 32.0–48.0)
pH, Arterial: 7.3 — ABNORMAL LOW (ref 7.350–7.450)
pO2, Arterial: 73 mmHg — ABNORMAL LOW (ref 83.0–108.0)

## 2021-01-18 LAB — TROPONIN I (HIGH SENSITIVITY)
Troponin I (High Sensitivity): 8 ng/L (ref <18)
Troponin I (High Sensitivity): 17 ng/L (ref <18)

## 2021-01-18 LAB — MRSA NEXT GEN BY PCR, NASAL: MRSA by PCR Next Gen: NOT DETECTED

## 2021-01-18 LAB — LACTIC ACID, PLASMA
Lactic Acid, Venous: 2 mmol/L (ref 0.5–1.9)
Lactic Acid, Venous: 1.3 mmol/L (ref 0.5–1.9)

## 2021-01-18 LAB — BRAIN NATRIURETIC PEPTIDE: B Natriuretic Peptide: 16.3 pg/mL (ref 0.0–100.0)

## 2021-01-18 LAB — GLUCOSE, CAPILLARY: Glucose-Capillary: 112 mg/dL — ABNORMAL HIGH (ref 70–99)

## 2021-01-18 MED ORDER — SODIUM CHLORIDE 0.9 % IV SOLN
3.0000 g | Freq: Four times a day (QID) | INTRAVENOUS | Status: DC
  Administered 2021-01-18 – 2021-01-19 (×3): 3 g via INTRAVENOUS
  Filled 2021-01-18: qty 3
  Filled 2021-01-18: qty 8
  Filled 2021-01-18 (×2): qty 3

## 2021-01-18 MED ORDER — CHLORHEXIDINE GLUCONATE CLOTH 2 % EX PADS
6.0000 | MEDICATED_PAD | Freq: Every day | CUTANEOUS | Status: DC
  Administered 2021-01-18 – 2021-01-20 (×3): 6 via TOPICAL

## 2021-01-18 MED ORDER — HYDROMORPHONE HCL 1 MG/ML IJ SOLN
1.0000 mg | Freq: Once | INTRAMUSCULAR | Status: AC
  Administered 2021-01-18: 1 mg via INTRAVENOUS
  Filled 2021-01-18: qty 1

## 2021-01-18 MED ORDER — SODIUM CHLORIDE 0.9 % IV SOLN
1000.0000 mL | INTRAVENOUS | Status: DC
  Administered 2021-01-18 – 2021-01-21 (×4): 1000 mL via INTRAVENOUS

## 2021-01-18 MED ORDER — FAMOTIDINE 20 MG PO TABS: 20.0000 mg | ORAL_TABLET | Freq: Every day | ORAL | Status: DC

## 2021-01-18 MED ORDER — METOPROLOL TARTRATE 5 MG/5ML IV SOLN
5.0000 mg | INTRAVENOUS | Status: DC | PRN
  Administered 2021-01-19: 5 mg via INTRAVENOUS
  Filled 2021-01-18: qty 5

## 2021-01-18 MED ORDER — METHYLPREDNISOLONE SODIUM SUCC 125 MG IJ SOLR
80.0000 mg | Freq: Two times a day (BID) | INTRAMUSCULAR | Status: DC
  Administered 2021-01-18 (×2): 80 mg via INTRAVENOUS
  Filled 2021-01-18 (×2): qty 2

## 2021-01-18 MED ORDER — ONDANSETRON HCL 4 MG PO TABS: 4.0000 mg | ORAL_TABLET | Freq: Three times a day (TID) | ORAL | Status: DC | PRN

## 2021-01-18 MED ORDER — IPRATROPIUM-ALBUTEROL 0.5-2.5 (3) MG/3ML IN SOLN
3.0000 mL | Freq: Four times a day (QID) | RESPIRATORY_TRACT | Status: DC | PRN
  Administered 2021-01-26: 3 mL via RESPIRATORY_TRACT
  Filled 2021-01-18 (×2): qty 3

## 2021-01-18 MED ORDER — MORPHINE SULFATE (PF) 4 MG/ML IV SOLN: 4.0000 mg | INTRAVENOUS | Status: DC | PRN

## 2021-01-18 MED ORDER — ESCITALOPRAM OXALATE 10 MG PO TABS
10.0000 mg | ORAL_TABLET | Freq: Every day | ORAL | Status: DC
  Administered 2021-01-19 – 2021-01-23 (×5): 10 mg via ORAL
  Filled 2021-01-18 (×5): qty 1

## 2021-01-18 MED ORDER — DIPHENHYDRAMINE HCL 25 MG PO CAPS: 25.0000 mg | ORAL_CAPSULE | Freq: Four times a day (QID) | ORAL | Status: DC | PRN

## 2021-01-18 MED ORDER — LORAZEPAM 2 MG/ML IJ SOLN
0.5000 mg | Freq: Once | INTRAMUSCULAR | Status: AC
  Administered 2021-01-18: 0.5 mg via INTRAVENOUS
  Filled 2021-01-18: qty 1

## 2021-01-18 MED ORDER — FLUTICASONE FUROATE-VILANTEROL 100-25 MCG/INH IN AEPB
1.0000 | INHALATION_SPRAY | Freq: Every day | RESPIRATORY_TRACT | Status: DC
  Administered 2021-01-19 – 2021-01-21 (×4): 1 via RESPIRATORY_TRACT
  Filled 2021-01-18 (×2): qty 28

## 2021-01-18 MED ORDER — AMOXICILLIN-POT CLAVULANATE 875-125 MG PO TABS: 1.0000 | ORAL_TABLET | Freq: Two times a day (BID) | ORAL | Status: DC

## 2021-01-18 MED ORDER — SENNOSIDES-DOCUSATE SODIUM 8.6-50 MG PO TABS
1.0000 | ORAL_TABLET | Freq: Two times a day (BID) | ORAL | Status: DC
  Administered 2021-01-19 – 2021-01-25 (×8): 1 via ORAL
  Filled 2021-01-18 (×10): qty 1

## 2021-01-18 MED ORDER — GUAIFENESIN 100 MG/5ML PO SOLN: 200.0000 mg | Freq: Four times a day (QID) | ORAL | Status: DC | PRN

## 2021-01-18 MED ORDER — CHLORHEXIDINE GLUCONATE 0.12 % MT SOLN
15.0000 mL | Freq: Two times a day (BID) | OROMUCOSAL | Status: DC
  Administered 2021-01-18 – 2021-01-27 (×16): 15 mL via OROMUCOSAL
  Filled 2021-01-18 (×15): qty 15

## 2021-01-18 MED ORDER — METHYLPREDNISOLONE SODIUM SUCC 40 MG IJ SOLR: 40.0000 mg | Freq: Four times a day (QID) | INTRAMUSCULAR | Status: DC

## 2021-01-18 MED ORDER — HYDROMORPHONE HCL 1 MG/ML IJ SOLN
0.5000 mg | INTRAMUSCULAR | Status: DC | PRN
  Administered 2021-01-18 – 2021-01-20 (×15): 0.5 mg via INTRAVENOUS
  Filled 2021-01-18 (×16): qty 1

## 2021-01-18 MED ORDER — IPRATROPIUM-ALBUTEROL 0.5-2.5 (3) MG/3ML IN SOLN
3.0000 mL | RESPIRATORY_TRACT | Status: DC
  Administered 2021-01-18 – 2021-01-19 (×10): 3 mL via RESPIRATORY_TRACT
  Filled 2021-01-18 (×9): qty 3

## 2021-01-18 MED ORDER — LORAZEPAM 2 MG/ML IJ SOLN
1.0000 mg | INTRAMUSCULAR | Status: DC | PRN
  Administered 2021-01-18 – 2021-01-21 (×11): 1 mg via INTRAVENOUS
  Filled 2021-01-18 (×11): qty 1

## 2021-01-18 MED ORDER — SODIUM CHLORIDE 0.9 % IV SOLN
1.0000 g | Freq: Once | INTRAVENOUS | Status: AC
  Administered 2021-01-18: 1 g via INTRAVENOUS
  Filled 2021-01-18: qty 10

## 2021-01-18 MED ORDER — PANTOPRAZOLE SODIUM 40 MG IV SOLR
40.0000 mg | Freq: Every day | INTRAVENOUS | Status: DC
  Administered 2021-01-18 – 2021-01-21 (×4): 40 mg via INTRAVENOUS
  Filled 2021-01-18 (×4): qty 40

## 2021-01-18 MED ORDER — MAGNESIUM SULFATE 2 GM/50ML IV SOLN
2.0000 g | Freq: Once | INTRAVENOUS | Status: AC
  Administered 2021-01-18: 2 g via INTRAVENOUS
  Filled 2021-01-18: qty 50

## 2021-01-18 MED ORDER — SODIUM CHLORIDE 0.9 % IV SOLN
500.0000 mg | Freq: Once | INTRAVENOUS | Status: AC
  Administered 2021-01-18: 500 mg via INTRAVENOUS
  Filled 2021-01-18: qty 500

## 2021-01-18 MED ORDER — CLONAZEPAM 0.125 MG PO TBDP: 0.2500 mg | ORAL_TABLET | Freq: Three times a day (TID) | ORAL | Status: DC

## 2021-01-18 MED ORDER — SODIUM CHLORIDE 0.9 % IV BOLUS (SEPSIS)
500.0000 mL | Freq: Once | INTRAVENOUS | Status: AC
  Administered 2021-01-18: 500 mL via INTRAVENOUS

## 2021-01-18 MED ORDER — ORAL CARE MOUTH RINSE
15.0000 mL | Freq: Two times a day (BID) | OROMUCOSAL | Status: DC
  Administered 2021-01-18 – 2021-01-27 (×12): 15 mL via OROMUCOSAL

## 2021-01-18 MED ORDER — SUMATRIPTAN SUCCINATE 50 MG PO TABS
50.0000 mg | ORAL_TABLET | ORAL | Status: DC | PRN
  Filled 2021-01-18: qty 1

## 2021-01-18 MED ORDER — METHYLPREDNISOLONE SODIUM SUCC 125 MG IJ SOLR
125.0000 mg | Freq: Once | INTRAMUSCULAR | Status: AC
  Administered 2021-01-18: 125 mg via INTRAVENOUS
  Filled 2021-01-18: qty 2

## 2021-01-18 MED ORDER — ONDANSETRON HCL 4 MG/2ML IJ SOLN
4.0000 mg | Freq: Four times a day (QID) | INTRAMUSCULAR | Status: DC | PRN
  Administered 2021-01-19 – 2021-01-21 (×3): 4 mg via INTRAVENOUS
  Filled 2021-01-18 (×3): qty 2

## 2021-01-18 MED ORDER — POLYETHYLENE GLYCOL 3350 17 G PO PACK: 17.0000 g | PACK | Freq: Every day | ORAL | Status: DC | PRN

## 2021-01-18 MED ORDER — BENZONATATE 100 MG PO CAPS: 100.0000 mg | ORAL_CAPSULE | Freq: Three times a day (TID) | ORAL | Status: DC | PRN

## 2021-01-18 MED ORDER — FUROSEMIDE 10 MG/ML IJ SOLN
40.0000 mg | Freq: Two times a day (BID) | INTRAMUSCULAR | Status: DC
  Administered 2021-01-18 – 2021-01-27 (×18): 40 mg via INTRAVENOUS
  Filled 2021-01-18 (×19): qty 4

## 2021-01-18 MED ORDER — POTASSIUM CHLORIDE CRYS ER 20 MEQ PO TBCR
40.0000 meq | EXTENDED_RELEASE_TABLET | Freq: Once | ORAL | Status: DC
  Filled 2021-01-18: qty 2

## 2021-01-18 MED ORDER — NALOXONE HCL 4 MG/0.1ML NA LIQD
1.0000 | Freq: Every day | NASAL | Status: DC | PRN
  Filled 2021-01-18: qty 8

## 2021-01-18 MED ORDER — SUCRALFATE 1 G PO TABS: 1.0000 g | ORAL_TABLET | Freq: Every day | ORAL | Status: DC | PRN

## 2021-01-18 MED ORDER — ENOXAPARIN SODIUM 40 MG/0.4ML IJ SOSY
40.0000 mg | PREFILLED_SYRINGE | Freq: Every day | INTRAMUSCULAR | Status: DC
  Administered 2021-01-18 – 2021-01-21 (×4): 40 mg via SUBCUTANEOUS
  Filled 2021-01-18 (×4): qty 0.4

## 2021-01-18 NOTE — Progress Notes (Signed)
Pharmacy Antibiotic Note  Chelsea Mcintosh is a 60 y.o. female admitted on 01/18/2021 with pneumonia.  Pharmacy has been consulted for Unasyn dosing.  Plan: Unasyn 3g IV q6 Will sign off     Temp (24hrs), Avg:98 F (36.7 C), Min:98 F (36.7 C), Max:98 F (36.7 C)  Recent Labs  Lab 01/12/21 2207 01/13/21 0344 01/14/21 0546 01/16/21 0456 01/18/21 1004 01/18/21 1031 01/18/21 1231  WBC 7.5 7.7 5.5 5.7 8.4  --   --   CREATININE 0.60 0.60 0.58 0.61 0.41*  --   --   LATICACIDVEN 1.6  --   --   --   --  2.0* 1.3    Estimated Creatinine Clearance: 66 mL/min (A) (by C-G formula based on SCr of 0.41 mg/dL (L)).    Allergies  Allergen Reactions   Vancomycin Itching   Flagyl [Metronidazole]     Pt states it made her "feel weird and nauseous"   Morphine Other (See Comments)    Mental status changes, hallucinations    Sulfasalazine     Severe burning sensation in the esophagus.   Tape     Burning sensation of the skin if left on too long   Thank you for allowing pharmacy to be a part of this patient's care.  Kara Mead 01/18/2021 3:16 PM

## 2021-01-18 NOTE — ED Notes (Signed)
PO meds held at this time d/t Bipap initiation. Roosevelt Locks, MD notified by this RN.

## 2021-01-18 NOTE — H&P (Addendum)
History and Physical    SHECID WASSMANN [id number] DOB: 04-25-1961 DOA: 01/18/2021  PCP: System, Provider Not In (Confirm with patient/family/NH records and if not entered, this has to be entered at [facility] point of entry) Patient coming from: Home  I have personally briefly reviewed patient's old medical records in [place]  Chief Complaint: SOB  HPI: Chelsea Mcintosh is a 60 y.o. 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  Patient was discharged from hospital yesterday after course treatment of bacterial pneumonia, and yesterday was discharged to home hospice.  According to oncology, hospitalist and palliative care's note, there were extensive discussion regarding extremely poor prognosis, however patient and her mother adamant about patient remain on full code.  Mother reported patient went home and there was no hospice equipment including oxygen set up.  Overnight, patient developed increasing shortness of breath.  No cough, no fever chills, no chest pains.  ED Course: O2 saturation dropped to 70s, was on high flow oxygen switched to BiPAP.  Review of Systems: Unable to answer all my question due to extreme respiratory distress  Past Medical History:  Diagnosis Date   Ovarian cancer [facility])     Past Surgical History:  Procedure Laterality Date   CESAREAN SECTION     1988   COLON RESECTION     underwent 2 resection of tumor of colon by Dr. Antonietta Barcelona in [place]     2015   miscarriage     1989   MYOMECTOMY     for fibroid removal 1998   right lumpectomy     1998     reports that she has never smoked. She has never used smokeless tobacco. She reports that she does not drink alcohol and does not use drugs.  Allergies  Allergen Reactions   Vancomycin Itching   Flagyl [Metronidazole]     Pt states it  made her "feel weird and nauseous"   Morphine Other (See Comments)    Mental status changes, hallucinations    Sulfasalazine     Severe burning sensation in the esophagus.   Tape     Burning sensation of the skin if left on too long    Family History  Problem Relation Age of Onset   Cancer Sister    Breast cancer Sister    Stroke Maternal Grandmother    Heart attack Paternal Grandfather      Prior to Admission medications   Medication Sig Start Date End Date Taking? Authorizing Provider  amoxicillin-clavulanate (AUGMENTIN) 875-125 MG tablet Take 1 tablet by mouth 2 (two) times daily for 2 days. Take for 7 days. Started 7/24 01/17/21 01/19/21 Yes Aline August, MD  benzonatate (TESSALON) 100 MG capsule Take 1 capsule (100 mg total) by mouth 3 (three) times daily as needed for cough. 01/17/21  Yes Aline August, MD  budesonide-formoterol (SYMBICORT) 80-4.5 MCG/ACT inhaler Inhale 2 puffs into the lungs 2 (two) times daily. For 30 days.   Yes [provider]  clonazePAM (KLONOPIN) 0.25 MG disintegrating tablet Take 1 tablet (0.25 mg total) by mouth 3 (three) times daily. 01/17/21  Yes Aline August, MD  diphenhydrAMINE (BENADRYL) 25 MG tablet Take 25 mg by mouth every 6 (six) hours as needed for itching.   Yes [provider]  guaifenesin (ROBITUSSIN) 100 MG/5ML syrup Take 200 mg by mouth 4 (four) times daily  as needed for cough.   Yes [provider]  hydrocerin (EUCERIN) CREA Apply 1 application topically daily as needed (dry skin).   Yes [provider]  ipratropium-albuterol (DUONEB) 0.5-2.5 (3) MG/3ML SOLN Take 3 mLs by nebulization every 6 (six) hours as needed (bronchospasms).   Yes [provider]  ondansetron (ZOFRAN) 4 MG tablet Take 4 mg by mouth every 8 (eight) hours as needed for nausea or vomiting.   Yes [provider]  oxyCODONE-acetaminophen (PERCOCET) 10-325 MG tablet Take 1 tablet by mouth every 6 (six) hours as needed for  pain. 01/09/21  Yes [provider]  Polyethyl Glycol-Propyl Glycol (SYSTANE) 0.4-0.3 % SOLN Place 1 drop into both eyes daily.   Yes [provider]  polyethylene glycol (MIRALAX / GLYCOLAX) 17 g packet Take 17 g by mouth daily as needed for mild constipation. 01/17/21  Yes Aline August, MD  senna-docusate (SENOKOT-S) 8.6-50 MG tablet Take 1 tablet by mouth 2 (two) times daily. Patient taking differently: Take 1 tablet by mouth 2 (two) times daily as needed for mild constipation. 01/17/21  Yes Aline August, MD  sucralfate (CARAFATE) 1 g tablet Take 1 g by mouth daily as needed (acid reflux).   Yes [provider]  SUMAtriptan (IMITREX) 50 MG tablet Take 50 mg by mouth every 2 (two) hours as needed for migraine. May repeat in 2 hours if headache persists or recurs.   Yes [provider]  escitalopram (LEXAPRO) 10 MG tablet Take 10 mg by mouth daily.    [provider]  famotidine (PEPCID) 20 MG tablet Take 20 mg by mouth daily.    [provider]  LORazepam (ATIVAN) 1 MG tablet Take 1 tablet (1 mg total) by mouth every 4 (four) hours as needed for anxiety. 01/17/21   Aline August, MD  methocarbamol (ROBAXIN) 500 MG tablet Take 500 mg by mouth at bedtime.    [provider]  naloxone [facility]) nasal spray 4 mg/0.1 mL Place 1 spray into the nose daily as needed (narcotic overdose).    [provider]  oxyCODONE (OXY IR/ROXICODONE) 5 MG immediate release tablet Take 1 tablet (5 mg total) by mouth every 4 (four) hours as needed for moderate pain or severe pain. 01/17/21   Aline August, MD  oxyCODONE 30 MG 12 hr tablet Take 1 tablet (30 mg total) by mouth every 12 (twelve) hours. 01/17/21   Aline August, MD    Physical Exam: Vitals:   01/18/21 1305 01/18/21 1340 01/18/21 1343 01/18/21 1405  BP: 116/78 (!) 156/87 (!) 156/87   Pulse: 70  (!) 120 (!) 130  Resp: 20  16 (!) 33  Temp:      TempSrc:      SpO2: 96%  99% 100%     Constitutional: NAD, calm, comfortable Vitals:   01/18/21 1305 01/18/21 1340 01/18/21 1343 01/18/21 1405  BP: 116/78 (!) 156/87 (!) 156/87   Pulse: 70  (!) 120 (!) 130  Resp: 20  16 (!) 33  Temp:      TempSrc:      SpO2: 96%  99% 100%   Eyes: PERRL, lids and conjunctivae normal ENMT: Mucous membranes are moist. Posterior pharynx clear of any exudate or lesions.Normal dentition.  Neck: normal, supple, no masses, no thyromegaly Respiratory: Diminished breathing sound bilaterally, diffused wheezing, scattered crackles.  Increasing respiratory effort.  Positive signs of accessory muscle use.  Cardiovascular: Regular rate and rhythm, no murmurs / rubs / gallops. No extremity edema. 2+  pedal pulses. No carotid bruits.  Abdomen: no tenderness, no masses palpated. No hepatosplenomegaly. Bowel sounds positive.  Musculoskeletal: no clubbing / cyanosis. No joint deformity upper and lower extremities. Good ROM, no contractures. Normal muscle tone.  Skin: no rashes, lesions, ulcers. No induration Neurologic: No facial droops, restless, moving all limbs. Psychiatric: In severe respiratory distress, anxious.    Labs on Admission: I have personally reviewed following labs and imaging studies  CBC: Recent Labs  Lab 01/12/21 2207 01/13/21 0344 01/14/21 0546 01/16/21 0456 01/18/21 1004  WBC 7.5 7.7 5.5 5.7 8.4  NEUTROABS 6.1  --   --  4.0  --   HGB 14.2 12.3 12.1 11.2* 12.0  HCT 45.4 40.5 39.6 37.2 39.6  MCV 90.1 90.2 90.0 91.2 90.8  PLT 310 272 274 284 [id number]   Basic Metabolic Panel: Recent Labs  Lab 01/12/21 2207 01/13/21 0344 01/14/21 0546 01/16/21 0456 01/18/21 1004  NA 135 135 136 137 137  K 3.9 3.8 3.7 3.5 3.3*  CL 99 102 99 98 96*  CO2 '25 23 28 29 30  '$ GLUCOSE 97 101* 98 90 113*  BUN 5* <5* 5* <5* 6*  CREATININE 0.60 0.60 0.58 0.61 0.41*  CALCIUM 8.9 8.2* 8.7* 8.5* 8.7*  MG  --   --   --  1.8  --    GFR: Estimated Creatinine Clearance: 66 mL/min (A) (by C-G formula  based on SCr of 0.41 mg/dL (L)). Liver Function Tests: Recent Labs  Lab 01/12/21 2207 01/16/21 0456  AST 25 25  ALT 18 19  ALKPHOS 74 65  BILITOT 0.5 0.8  PROT 6.9 5.7*  ALBUMIN 2.6* 2.1*   No results for input(s): LIPASE, AMYLASE in the last 168 hours. No results for input(s): AMMONIA in the last 168 hours. Coagulation Profile: Recent Labs  Lab 01/12/21 2207  INR 1.2   Cardiac Enzymes: No results for input(s): CKTOTAL, CKMB, CKMBINDEX, TROPONINI in the last 168 hours. BNP (last 3 results) No results for input(s): PROBNP in the last 8760 hours. HbA1C: No results for input(s): HGBA1C in the last 72 hours. CBG: Recent Labs  Lab 01/14/21 1709  GLUCAP 91   Lipid Profile: No results for input(s): CHOL, HDL, LDLCALC, TRIG, CHOLHDL, LDLDIRECT in the last 72 hours. Thyroid Function Tests: No results for input(s): TSH, T4TOTAL, FREET4, T3FREE, THYROIDAB in the last 72 hours. Anemia Panel: No results for input(s): VITAMINB12, FOLATE, FERRITIN, TIBC, IRON, RETICCTPCT in the last 72 hours. Urine analysis: No results found for: COLORURINE, APPEARANCEUR, [place], [place], GLUCOSEU, HGBUR, BILIRUBINUR, KETONESUR, PROTEINUR, UROBILINOGEN, NITRITE, LEUKOCYTESUR  Radiological Exams on Admission: DG Chest Port 1 View  Result Date: 01/18/2021 CLINICAL DATA:  Shortness of breath EXAM: PORTABLE CHEST 1 VIEW COMPARISON:  01/12/2021 FINDINGS: Right Port-A-Cath remains in place, unchanged. Nodular airspace disease again noted throughout both lungs, worsening since prior study. This could reflect pneumonia, metastatic disease, or a combination. No visible effusions or pneumothorax. IMPRESSION: Worsening bilateral airspace disease, some of which appears nodular. This could reflect pneumonia, metastatic disease or a combination. Electronically Signed   By: Rolm Baptise M.D.   On: 01/18/2021 10:18    EKG: Independently reviewed.  Sinus tachycardia  Assessment/Plan Active Problems:   Hypoxia   (please populate well all problems here in Problem List. (For example, if patient is on BP meds at home and you resume or decide to hold them, it is a problem that needs to be her. Same for CAD, COPD, HLD and so on)  ARDS -Secondary to  diffused lung metastasis cancer from ovarian original.  Prognosis extremely poor, expect life expectancy few days to few weeks.  Long discussion with patient mother at bedside, not sure how much patient can comprehend this time due to severe respiratory distress.  Explained to mother that ARDS itself has significant mortality this is in addition to her underlying metastatic cancer in the lungs and possible pneumonia as well as a COPD exacerbation.  And aggressive treatment including intubation will just make patient suffer more without benefit of improving life quality or even prolong her life.  Despite, mother remains adamant that patient get every possible treatment.  Then mother became very emotional and refused further conversation.  We will try to go back to this afternoon to have another conversation but as of now patient is full code. -Patient reported that she was just diagnosed with metastatic cancer in the lungs 2 weeks ago at [place], bone bilateral thoracentesis was done drained about total of 2 L fluid.  Echocardiogram on last admission did not show significant CHF.  X-ray has no resolution to tell how much fluid.  There today.  Decided to give 1 dose of IV Lasix to relieve her symptoms. -She also sized signs of COPD exacerbation, gave 1 dose of IV Solu-Medrol 125 mg, then start IV Solu-Medrol every 6 hours and breathing treatment. -Start as needed Dilaudid and Ativan as per patient's request, consult palliative care.  Sinus tachycardia -As needed metoprolol IV push.  Metastatic ovarian cancer to the lungs -As above.  Acute COPD exacerbation. -As above.  Bacterial pneumonia -No fever no leukocytosis, continue Augmentin.  DVT prophylaxis: Lovenox Code  Status: Full code Family Communication: Mother at bedside Disposition Plan: Expect more than 2 midnight hospital stay, inpatient hospice versus home hospice. Consults called: Palliative care Admission status: Stepdown unit   Lequita Halt MD [facility] Pager [phone]  01/18/2021, 2:53 PM

## 2021-01-18 NOTE — Progress Notes (Addendum)
Manufacturing engineer [facility]) Hospital Liaison note.  This patient was referred to [facility] for hospice services in her home after discharge. [facility] admission nurse visited patient in home on 01/17/2021 at [facility] to do admission to hospice. Patient's mother said she did not want sign to papers because the oxygen concentrator was too loud and she was going to have to make other arrangements. [employer] was contacted about complaint and they stated all of their concentrators were the same. [facility] attempted to change DME providers but was unable to do it before today.   [facility] will continue to follow for disposition. Please advise if patient is to discharge home so that any needed DME can be ordered.   Thank you,     Farrel Gordon, RN, [facility] Liaison  [phone]

## 2021-01-18 NOTE — ED Triage Notes (Signed)
Patient BIBA c/o SOB since this morning. SpO2 74% RA on arrival, 100% w/ NRB. Hx ovarian CA and starting hospice care. Reports PNA 2 weeks ago and had a recent thoracentesis. Uses 2L [place] at home (sometimes). Patient reports she feels extremely anxious. VS WDL.

## 2021-01-18 NOTE — ED Provider Notes (Signed)
[facility] DEPT Provider Note   CSN: [id number] Arrival date & time: 01/18/21  [id number]     History Chief Complaint  Patient presents with   Shortness of Breath    Chelsea Mcintosh is a 60 y.o. female.   Shortness of Breath  Patient presents to the ED with complaints of shortness of breath.  Patient states she just left the hospital yesterday.  She felt she was doing okay.  This morning however she started to feel very short of breath.  Its worse when she is lying flat.  She feels better sitting up.  She is feeling very anxious.  Patient feels like she needs some Dilaudid and Ativan.  Usually that helps her.  According to the medical records the patient was admitted to the hospital on July 27 and was discharged yesterday.  She was treated for acute hypoxic respiratory failure.  She was diagnosed with multifocal pneumonia.  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  Patient was evaluated by palliative care but apparently she still wants to remain full code although she was post to discharge on hospice.  Patient was also noted to have a moderate pericardial effusion.  Past Medical History:  Diagnosis Date   Ovarian cancer [facility])     Patient Active Problem List   Diagnosis Date Noted   HCAP (healthcare-associated pneumonia) 01/13/2021    Past Surgical History:  Procedure Laterality Date   CESAREAN SECTION     1988   COLON RESECTION     underwent 2 resection of tumor of colon by Dr. Antonietta Barcelona in [place]     2015   miscarriage     [facility]     for fibroid removal 1998   right lumpectomy     1998     OB History   No obstetric history on file.     Family History  Problem Relation Age of Onset   Cancer Sister    Breast cancer Sister    Stroke Maternal Grandmother    Heart attack Paternal Grandfather     Social History   Tobacco Use   Smoking status:  Never   Smokeless tobacco: Never  Substance Use Topics   Alcohol use: Never   Drug use: Never    Home Medications Prior to Admission medications   Medication Sig Start Date End Date Taking? Authorizing Provider  amoxicillin-clavulanate (AUGMENTIN) 875-125 MG tablet Take 1 tablet by mouth 2 (two) times daily for 2 days. Take for 7 days. Started 7/24 01/17/21 01/19/21  Aline August, MD  benzonatate (TESSALON) 100 MG capsule Take 1 capsule (100 mg total) by mouth 3 (three) times daily as needed for cough. 01/17/21   Aline August, MD  budesonide-formoterol (SYMBICORT) 80-4.5 MCG/ACT inhaler Inhale 2 puffs into the lungs 2 (two) times daily. For 30 days.    [provider]  clonazePAM (KLONOPIN) 0.25 MG disintegrating tablet Take 1 tablet (0.25 mg total) by mouth 3 (three) times daily. 01/17/21   Aline August, MD  diphenhydrAMINE (BENADRYL) 25 MG tablet Take 25 mg by mouth every 6 (six) hours as needed for itching.    [provider]  escitalopram (LEXAPRO) 10 MG tablet Take 10 mg by mouth daily.    [provider]  famotidine (PEPCID) 20 MG tablet Take 20 mg by mouth daily.    [provider]  guaifenesin (ROBITUSSIN) 100 MG/5ML syrup Take  200 mg by mouth 4 (four) times daily as needed for cough.    [provider]  hydrocerin (EUCERIN) CREA Apply 1 application topically daily as needed (dry skin).    [provider]  ipratropium-albuterol (DUONEB) 0.5-2.5 (3) MG/3ML SOLN Take 3 mLs by nebulization every 6 (six) hours as needed (bronchospasms).    [provider]  LORazepam (ATIVAN) 1 MG tablet Take 1 tablet (1 mg total) by mouth every 4 (four) hours as needed for anxiety. 01/17/21   Aline August, MD  methocarbamol (ROBAXIN) 500 MG tablet Take 500 mg by mouth at bedtime.    [provider]  naloxone [facility]) nasal spray 4 mg/0.1 mL Place 1 spray into the nose daily as needed (narcotic overdose).    [provider]   ondansetron (ZOFRAN) 4 MG tablet Take 4 mg by mouth every 8 (eight) hours as needed for nausea or vomiting.    [provider]  oxyCODONE (OXY IR/ROXICODONE) 5 MG immediate release tablet Take 1 tablet (5 mg total) by mouth every 4 (four) hours as needed for moderate pain or severe pain. 01/17/21   Aline August, MD  oxyCODONE 30 MG 12 hr tablet Take 1 tablet (30 mg total) by mouth every 12 (twelve) hours. 01/17/21   Aline August, MD  Polyethyl Glycol-Propyl Glycol (SYSTANE) 0.4-0.3 % SOLN Apply 1 drop to eye daily. Both eyes.    [provider]  polyethylene glycol (MIRALAX / GLYCOLAX) 17 g packet Take 17 g by mouth daily as needed for mild constipation. 01/17/21   Aline August, MD  senna-docusate (SENOKOT-S) 8.6-50 MG tablet Take 1 tablet by mouth 2 (two) times daily. 01/17/21   Aline August, MD  sucralfate (CARAFATE) 1 g tablet Take 1 g by mouth daily as needed (acid reflux).    [provider]  SUMAtriptan (IMITREX) 50 MG tablet Take 50 mg by mouth every 2 (two) hours as needed for migraine. May repeat in 2 hours if headache persists or recurs.    [provider]    Allergies    Vancomycin, Flagyl [metronidazole], Sulfasalazine, and Tape  Review of Systems   Review of Systems  Respiratory:  Positive for shortness of breath.   All other systems reviewed and are negative.  Physical Exam Updated Vital Signs BP (!) 163/71   Pulse (!) 134   Temp 98 F (36.7 C) (Oral)   Resp 20   SpO2 95%   Physical Exam Vitals and nursing note reviewed.  Constitutional:      Appearance: She is ill-appearing.  HENT:     Head: Normocephalic and atraumatic.     Right Ear: External ear normal.     Left Ear: External ear normal.  Eyes:     General: No scleral icterus.       Right eye: No discharge.        Left eye: No discharge.     Conjunctiva/sclera: Conjunctivae normal.  Neck:     Trachea: No tracheal deviation.  Cardiovascular:     Rate and Rhythm: Normal  rate and regular rhythm.  Pulmonary:     Effort: Tachypnea and accessory muscle usage present.     Breath sounds: Normal breath sounds. No stridor. No wheezing or rales.  Abdominal:     General: Bowel sounds are normal. There is no distension.     Palpations: Abdomen is soft.     Tenderness: There is no abdominal tenderness. There is no guarding or rebound.  Musculoskeletal:  General: No tenderness or deformity.     Cervical back: Neck supple.  Skin:    General: Skin is warm and dry.     Findings: No rash.  Neurological:     General: No focal deficit present.     Mental Status: She is alert.     Cranial Nerves: No cranial nerve deficit (no facial droop, extraocular movements intact, no slurred speech).     Sensory: No sensory deficit.     Motor: No abnormal muscle tone or seizure activity.     Coordination: Coordination normal.  Psychiatric:        Mood and Affect: Mood normal.    ED Results / Procedures / Treatments   Labs (all labs ordered are listed, but only abnormal results are displayed) Labs Reviewed  BASIC METABOLIC PANEL - Abnormal; Notable for the following components:      Result Value   Potassium 3.3 (*)    Chloride 96 (*)    Glucose, Bld 113 (*)    BUN 6 (*)    Creatinine, Ser 0.41 (*)    Calcium 8.7 (*)    All other components within normal limits  CBC  BRAIN NATRIURETIC PEPTIDE  LACTIC ACID, PLASMA  LACTIC ACID, PLASMA  TROPONIN I (HIGH SENSITIVITY)  TROPONIN I (HIGH SENSITIVITY)    EKG EKG Interpretation  Date/Time:  Tuesday January 18 2021 10:32:56 EDT Ventricular Rate:  135 PR Interval:  138 QRS Duration: 79 QT Interval:  291 QTC Calculation: 437 R Axis:   85 Text Interpretation: Sinus tachycardia Atrial premature complex Probable left atrial enlargement Borderline right axis deviation Probable anteroseptal infarct, old Confirmed by Dorie Rank [phone]) on 01/18/2021 12:35:22 PM  Radiology DG Chest Port 1 View  Result Date:  01/18/2021 CLINICAL DATA:  Shortness of breath EXAM: PORTABLE CHEST 1 VIEW COMPARISON:  01/12/2021 FINDINGS: Right Port-A-Cath remains in place, unchanged. Nodular airspace disease again noted throughout both lungs, worsening since prior study. This could reflect pneumonia, metastatic disease, or a combination. No visible effusions or pneumothorax. IMPRESSION: Worsening bilateral airspace disease, some of which appears nodular. This could reflect pneumonia, metastatic disease or a combination. Electronically Signed   By: Rolm Baptise M.D.   On: 01/18/2021 10:18    Procedures .Critical Care  Date/Time: 01/18/2021 12:38 PM Performed by: Dorie Rank, MD Authorized by: Dorie Rank, MD   Critical care provider statement:    Critical care time (minutes):  45   Critical care was time spent personally by me on the following activities:  Discussions with consultants, evaluation of patient's response to treatment, examination of patient, ordering and performing treatments and interventions, ordering and review of laboratory studies, ordering and review of radiographic studies, pulse oximetry, re-evaluation of patient's condition, obtaining history from patient or surrogate and review of old charts   Medications Ordered in ED Medications  sodium chloride 0.9 % bolus 500 mL (0 mLs Intravenous Stopped 01/18/21 1127)    Followed by  0.9 %  sodium chloride infusion (1,000 mLs Intravenous New Bag/Given 01/18/21 1128)  LORazepam (ATIVAN) injection 0.5 mg (0.5 mg Intravenous Given 01/18/21 1021)  HYDROmorphone (DILAUDID) injection 1 mg (1 mg Intravenous Given 01/18/21 1021)  cefTRIAXone (ROCEPHIN) 1 g in sodium chloride 0.9 % 100 mL IVPB (0 g Intravenous Stopped 01/18/21 1127)  azithromycin (ZITHROMAX) 500 mg in sodium chloride 0.9 % 250 mL IVPB (0 mg Intravenous Stopped 01/18/21 1219)    ED Course  I have reviewed the triage vital signs and the nursing notes.  Pertinent labs & imaging results that were available during my  care of the patient were reviewed by me and considered in my medical decision making (see chart for details).  Clinical Course as of 01/18/21 1238  Tue Jan 18, 2021  1030 Chest x-ray shows worsening airspace disease [JK]  1234 Patient's laboratory test show normal CBC.  Electrolyte panel is unremarkable.  BNP is normal. [JK]  1234 Chest x-ray however shows worsening findings [JK]    Clinical Course User Index [JK] Dorie Rank, MD   MDM Rules/Calculators/A&P                           Patient presented to the ED with complaints of worsening shortness of breath.  Patient does have history of ovarian CA.  Recent x-rays suggest diffuse pulmonary metastases.  Patient was recently discharged from the hospital with palliative care planned however patient wanted to remain full code.  Patient continues to have respiratory difficulty.  She is tachycardic.  We will start her on antibiotics but suspect this is related to her malignancy.  Unfortunately patient's prognosis is very poor.  We will continue with supportive care.  We will order BiPAP for comfort.  I will consult the medical service and palliative care service for admission Final Clinical Impression(s) / ED Diagnoses Final diagnoses:  Metastatic malignant neoplasm, unspecified site [facility])  Acute on chronic respiratory failure with hypoxia ([place])     Dorie Rank, MD 01/18/21 1238

## 2021-01-19 DIAGNOSIS — R0902 Hypoxemia: Secondary | ICD-10-CM | POA: Diagnosis not present

## 2021-01-19 DIAGNOSIS — Z7189 Other specified counseling: Secondary | ICD-10-CM

## 2021-01-19 DIAGNOSIS — Z515 Encounter for palliative care: Principal | ICD-10-CM

## 2021-01-19 DIAGNOSIS — J9601 Acute respiratory failure with hypoxia: Secondary | ICD-10-CM | POA: Diagnosis present

## 2021-01-19 DIAGNOSIS — C7800 Secondary malignant neoplasm of unspecified lung: Secondary | ICD-10-CM

## 2021-01-19 DIAGNOSIS — G893 Neoplasm related pain (acute) (chronic): Secondary | ICD-10-CM | POA: Diagnosis present

## 2021-01-19 LAB — SARS CORONAVIRUS 2 (TAT 6-24 HRS): SARS Coronavirus 2: NEGATIVE

## 2021-01-19 MED ORDER — FENTANYL 50 MCG/HR TD PT72
1.0000 | MEDICATED_PATCH | TRANSDERMAL | Status: DC
  Administered 2021-01-19 – 2021-01-25 (×3): 1 via TRANSDERMAL
  Filled 2021-01-19 (×3): qty 1

## 2021-01-19 MED ORDER — IPRATROPIUM-ALBUTEROL 0.5-2.5 (3) MG/3ML IN SOLN
3.0000 mL | Freq: Four times a day (QID) | RESPIRATORY_TRACT | Status: DC
  Administered 2021-01-20 – 2021-01-22 (×9): 3 mL via RESPIRATORY_TRACT
  Filled 2021-01-19 (×9): qty 3

## 2021-01-19 MED ORDER — OXYCODONE HCL 5 MG PO TABS
5.0000 mg | ORAL_TABLET | ORAL | Status: DC | PRN
  Administered 2021-01-19 – 2021-01-20 (×5): 5 mg via ORAL
  Filled 2021-01-19 (×5): qty 1

## 2021-01-19 NOTE — TOC Initial Note (Signed)
Transition of Care [facility]) - Initial/Assessment Note    Patient Details  Name: Chelsea Mcintosh MRN: [id number] Date of Birth: 1961/06/11  Transition [facility]) CM/SW Contact:    Leeroy Cha, RN Phone Number: 01/19/2021, 9:20 AM  Clinical Narrative:                 60 y.o. 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   Patient was discharged from hospital yesterday after course treatment of bacterial pneumonia, and yesterday was discharged to home hospice.  According to oncology, hospitalist and palliative care's note, there were extensive discussion regarding extremely poor prognosis, however patient and her mother adamant about patient remain on full code.   Mother reported patient went home and there was no hospice equipment including oxygen set up.  Overnight, patient developed increasing shortness of breath.  No cough, no fever chills, no chest pains.   ED Course: O2 saturation dropped to 70s, was on high flow oxygen switched to BiPAP. TOC PLAN OF CARE: will followup with [facility] about the home situation.  Following for progression. Expected Discharge Plan: Home w Hospice Care Barriers to Discharge: Continued Medical Work up   Patient Goals and CMS Choice        Expected Discharge Plan and Services Expected Discharge Plan: [place]   Discharge Planning Services: CM Consult   Living arrangements for the past 2 months: Single Family Home Expected Discharge Date:  (unknown)                                    Prior Living Arrangements/Services Living arrangements for the past 2 months: Single Family Home Lives with:: Parents   Do you feel safe going back to the place where you live?: Yes               Activities of Daily Living Home Assistive Devices/Equipment: Nebulizer, Oxygen ADL Screening (condition  at time of admission) Patient's cognitive ability adequate to safely complete daily activities?: Yes Is the patient deaf or have difficulty hearing?: No Does the patient have difficulty seeing, even when wearing glasses/contacts?: No Does the patient have difficulty concentrating, remembering, or making decisions?: No Patient able to express need for assistance with ADLs?: Yes Does the patient have difficulty dressing or bathing?: Yes Independently performs ADLs?: No Communication: Independent Dressing (OT): Needs assistance Is this a change from baseline?: Pre-admission baseline Grooming: Independent Feeding: Independent Bathing: Needs assistance Is this a change from baseline?: Pre-admission baseline Toileting: Needs assistance Is this a change from baseline?: Pre-admission baseline In/Out Bed: Needs assistance Is this a change from baseline?: Pre-admission baseline Walks in Home: Needs assistance Is this a change from baseline?: Pre-admission baseline Does the patient have difficulty walking or climbing stairs?: Yes (secondary to weakness and shortness of breath) Weakness of Legs: Both Weakness of Arms/Hands: None  Permission Sought/Granted                  Emotional Assessment       Orientation: : Oriented to Self, Oriented to Place, Oriented to  Time, Oriented to Situation Alcohol / Substance Use: Not Applicable Psych Involvement: No (comment)  Admission diagnosis:  Hypoxia [R09.02] Acute on chronic respiratory failure with hypoxia ([place]) [J96.21] Metastatic malignant neoplasm, unspecified site [facility]) [C79.9] Patient Active Problem List  Diagnosis Date Noted   Hypoxia 01/18/2021   Metastatic malignant neoplasm ([place])    HCAP (healthcare-associated pneumonia) 01/13/2021   PCP:  System, Provider Not In Pharmacy:   [id number] [address] [id number] [id number]:  [phone]: [phone] [id number] [address] [address] [place]: [phone]: [phone]   Social Determinants of Health (SDOH) Interventions    Readmission Risk Interventions No flowsheet data found.

## 2021-01-19 NOTE — Progress Notes (Signed)
Manufacturing engineer [facility]) Hospital Liaison note.  This patient has been to [facility] for hospice services in her home after discharge. [facility] attempted to admit pt to hospice services Monday evening in the home; at that time family declined to accept hospice services.  [facility] will continue to follow for disposition.   Domenic Moras, BSN, RN [facility] liaison [phone] (24h on call)

## 2021-01-19 NOTE — Progress Notes (Signed)
Pt seen and given scheduled nebulizer treatment which she tolerated well.  HR 108,rr12, spo2 96% on 4lnc.  No increased wob or respiratory distress noted or voiced by patient.  Bipap remains in room on standby but not indicated at this time.

## 2021-01-19 NOTE — Progress Notes (Signed)
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  According to the hospice documentation, looks like the oxygen concentrator made too much noise. Patient herself was complaining of pain and shortness of breath.  In the emergency room blood pressure is stable.  Oxygen 70% on room air, started on high flow and switched to BiPAP and admitted to stepdown unit. Patient remains full code despite poor prognosis, limited life and hospice enrollment.   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  Discontinue antibiotics and steroids. Continue all supportive care including oxygen, BiPAP as needed.  Adequate pain medications and anxiety medications. There may be communication/logistic issues with hospice arrangements. Appreciate palliative care seeing patient in the hospital. Patient is almost at end-of-life, if unable to be taken care at home, may benefit with residential hospice program.  Will defer palliative medicine team to coordinate with hospice programs. Remains full code, will continue to discuss with patient's mother. Increased dose of fentanyl patch, increased dose of short acting oxycodone and Dilaudid injectable for severe cancer pain.  Patient with limited life expectancy, nearing death due to  advanced cancer and extensive lung mets.  If she has to suffer from cardiopulmonary arrest, CPR including chest compression resuscitation and intubation will be futile care. Will continue to discuss and coordinate with patient and her mother.   DVT prophylaxis: enoxaparin (LOVENOX) injection 40 mg Start: 01/18/21 1445   Code Status: Full code.  Goal of care discussion ongoing. Family Communication: None at the bedside.  Will discuss with patient's mom. Disposition Plan: Status is: Inpatient  Remains inpatient appropriate because:IV treatments appropriate due to intensity of illness or inability to take PO and Inpatient level of care appropriate due to severity of illness  Dispo: The patient is from: Home              Anticipated d/c is to:  home hospice               Patient currently is not medically stable to d/c.   Difficult to place patient No         Consultants:  Palliative medicine  Procedures:  None  Antimicrobials:  Augmentin 8/2-8/3   Subjective: I examined patient in the early morning rounds.  Patient was very anxious.  She was complaining of pain.  She was wanting [place] to change her fentanyl patch and give her some oxycodone.  Patient tells me that she is miserable with pain all over, pain on her back.  No family at the bedside.  Patient was not able to discuss any discharge planning or hospice programs.  Objective: Vitals:   01/19/21 0742 01/19/21 0800 01/19/21 1130 01/19/21 1206  BP:  (!) 160/84    Pulse:  (!) 107    Resp:  (!) 25    Temp:  (!) 97.4 F (36.3 C) 97.7 F (36.5 C)  TempSrc:  Oral Axillary   SpO2: 96% 94%  95%  Weight:      Height:        Intake/Output Summary (Last 24 hours) at 01/19/2021 1354 Last data filed at 01/19/2021 0400 Gross per 24 hour  Intake 763.59 ml  Output 1300 ml  Net -536.41 ml   Filed Weights   01/18/21 1615  Weight: 65.9 kg    Examination:  General exam: Appears in moderate respiratory distress, anxious with  flat affect. Respiratory system: Poor bilateral air entry.  In moderate respiratory distress. SpO2: 95 % O2 Flow Rate (L/min): 4 L/min FiO2 (%): 40 %  Cardiovascular system: S1 & S2 heard, tachycardic. Gastrointestinal system: Soft.  Nontender.  Bowel sounds present.   Central nervous system: Alert and oriented.  Moves all extremities. Extremities: Symmetric 5 x 5 power.  Generalized weakness. Skin: No rashes, lesions or ulcers Psychiatry: Judgement and insight appear normal.  Mood is anxious.  Flat affect.    Data Reviewed: I have personally reviewed following labs and imaging studies  CBC: Recent Labs  Lab 01/12/21 2207 01/13/21 0344 01/14/21 0546 01/16/21 0456 01/18/21 1004  WBC 7.5 7.7 5.5 5.7 8.4  NEUTROABS 6.1  --   --  4.0  --   HGB 14.2 12.3 12.1 11.2* 12.0  HCT 45.4 40.5 39.6 37.2 39.6  MCV 90.1 90.2 90.0 91.2 90.8  PLT 310 272 274 284 [id number]   Basic Metabolic Panel: Recent Labs  Lab 01/12/21 2207 01/13/21 0344 01/14/21 0546 01/16/21 0456 01/18/21 1004  NA 135 135 136 137 137  K 3.9 3.8 3.7 3.5 3.3*  CL 99 102 99 98 96*  CO2 '25 23 28 29 30  '$ GLUCOSE 97 101* 98 90 113*  BUN 5* <5* 5* <5* 6*  CREATININE 0.60 0.60 0.58 0.61 0.41*  CALCIUM 8.9 8.2* 8.7* 8.5* 8.7*  MG  --   --   --  1.8  --    GFR: Estimated Creatinine Clearance: 68.1 mL/min (A) (by C-G formula based on SCr of 0.41 mg/dL (L)). Liver Function Tests: Recent Labs  Lab 01/12/21 2207 01/16/21 0456  AST 25 25  ALT 18 19  ALKPHOS 74 65  BILITOT 0.5 0.8  PROT 6.9 5.7*  ALBUMIN 2.6* 2.1*   No results for input(s): LIPASE, AMYLASE in the last 168 hours. No results for input(s): AMMONIA in the last 168 hours. Coagulation Profile: Recent Labs  Lab 01/12/21 2207  INR 1.2   Cardiac Enzymes: No results for input(s): CKTOTAL, CKMB, CKMBINDEX, TROPONINI in the last 168 hours. BNP (last 3 results) No results for input(s): PROBNP in the last 8760 hours. HbA1C: No results for input(s): HGBA1C  in the last 72 hours. CBG: Recent Labs  Lab 01/14/21 1709 01/18/21 1631  GLUCAP 91 112*   Lipid Profile: No results for input(s): CHOL, HDL, LDLCALC, TRIG, CHOLHDL, LDLDIRECT in the last 72 hours. Thyroid Function Tests: No results for input(s): TSH, T4TOTAL, FREET4, T3FREE, THYROIDAB in the last 72 hours. Anemia Panel: No results for input(s): VITAMINB12, FOLATE, FERRITIN, TIBC, IRON, RETICCTPCT in the last 72 hours. Sepsis Labs: Recent Labs  Lab 01/12/21 2207 01/13/21 0344 01/14/21 0546 01/15/21 0441 01/18/21 1031 01/18/21 1231  PROCALCITON  --  <0.10 <0.10 <0.10  --   --   LATICACIDVEN 1.6  --   --   --  2.0* 1.3    Recent Results (from the past 240 hour(s))  Blood culture (routine x 2)     Status: None  Collection Time: 01/12/21 10:07 PM   Specimen: BLOOD  Result Value Ref Range Status   Specimen Description BLOOD LEFT ANTECUBITAL  Final   Special Requests   Final    BOTTLES DRAWN AEROBIC AND ANAEROBIC Blood Culture adequate volume   Culture   Final    NO GROWTH 5 DAYS Performed at [facility], [address], [place], [place] [address]    Report Status 01/18/2021 FINAL  Final  Blood culture (routine x 2)     Status: None   Collection Time: 01/12/21 10:07 PM   Specimen: BLOOD RIGHT ARM  Result Value Ref Range Status   Specimen Description BLOOD RIGHT ARM  Final   Special Requests   Final    BOTTLES DRAWN AEROBIC ONLY Blood Culture adequate volume   Culture   Final    NO GROWTH 5 DAYS Performed at [facility], [place] [address], [place], [place] [address]    Report Status 01/18/2021 FINAL  Final  Resp Panel by RT-PCR (Flu A&B, Covid) Nasopharyngeal Swab     Status: None   Collection Time: 01/13/21 12:38 AM   Specimen: Nasopharyngeal Swab; Nasopharyngeal(NP) swabs in vial transport medium  Result Value Ref Range Status   SARS Coronavirus 2 by RT PCR NEGATIVE NEGATIVE Final    Comment: (NOTE) SARS-CoV-2 target nucleic acids are NOT  DETECTED.  The SARS-CoV-2 RNA is generally detectable in upper respiratory specimens during the acute phase of infection. The lowest concentration of SARS-CoV-2 viral copies this assay can detect is 138 copies/mL. A negative result does not preclude SARS-Cov-2 infection and should not be used as the sole basis for treatment or other patient management decisions. A negative result may occur with  improper specimen collection/handling, submission of specimen other than nasopharyngeal swab, presence of viral mutation(s) within the areas targeted by this assay, and inadequate number of viral copies(<138 copies/mL). A negative result must be combined with clinical observations, patient history, and epidemiological information. The expected result is Negative.  Fact Sheet for Patients:  EntrepreneurPulse.com.au  Fact Sheet for Healthcare Providers:  IncredibleEmployment.be  This test is no t yet approved or cleared by the [place] FDA and  has been authorized for detection and/or diagnosis of SARS-CoV-2 by FDA under an Emergency Use Authorization (EUA). This EUA will remain  in effect (meaning this test can be used) for the duration of the COVID-19 declaration under Section 564(b)(1) of the Act, 21 U.S.C.section 360bbb-3(b)(1), unless the authorization is terminated  or revoked sooner.       Influenza A by PCR NEGATIVE NEGATIVE Final   Influenza B by PCR NEGATIVE NEGATIVE Final    Comment: (NOTE) The Xpert Xpress SARS-CoV-2/FLU/RSV plus assay is intended as an aid in the diagnosis of influenza from Nasopharyngeal swab specimens and should not be used as a sole basis for treatment. Nasal washings and aspirates are unacceptable for Xpert Xpress SARS-CoV-2/FLU/RSV testing.  Fact Sheet for Patients: EntrepreneurPulse.com.au  Fact Sheet for Healthcare Providers: IncredibleEmployment.be  This test is not yet  approved or cleared by the [place] FDA and has been authorized for detection and/or diagnosis of SARS-CoV-2 by FDA under an Emergency Use Authorization (EUA). This EUA will remain in effect (meaning this test can be used) for the duration of the COVID-19 declaration under Section 564(b)(1) of the Act, 21 U.S.C. section 360bbb-3(b)(1), unless the authorization is terminated or revoked.  Performed at [facility], [place] [address], [place], [place] [address]   MRSA Next  Gen by PCR, Nasal     Status: None   Collection Time: 01/14/21 10:22 PM   Specimen: Nasal Mucosa; Nasal Swab  Result Value Ref Range Status   MRSA by PCR Next Gen NOT DETECTED NOT DETECTED Final    Comment: (NOTE) The GeneXpert MRSA Assay (FDA approved for NASAL specimens only), is one component of a comprehensive MRSA colonization surveillance program. It is not intended to diagnose MRSA infection nor to guide or monitor treatment for MRSA infections. Test performance is not FDA approved in patients less than 10 years old. Performed at [facility], [place] [address], [place], [place] [address]   SARS CORONAVIRUS 2 (TAT 6-24 HRS) Nasopharyngeal Nasopharyngeal Swab     Status: None   Collection Time: 01/18/21 12:50 PM   Specimen: Nasopharyngeal Swab  Result Value Ref Range Status   SARS Coronavirus 2 NEGATIVE NEGATIVE Final    Comment: (NOTE) SARS-CoV-2 target nucleic acids are NOT DETECTED.  The SARS-CoV-2 RNA is generally detectable in upper and lower respiratory specimens during the acute phase of infection. Negative results do not preclude SARS-CoV-2 infection, do not rule out co-infections with other pathogens, and should not be used as the sole basis for treatment or other patient management decisions. Negative results must be combined with clinical observations, patient history, and epidemiological information. The expected result is Negative.  Fact Sheet for  Patients: SugarRoll.be  Fact Sheet for Healthcare Providers: https://www.woods-mathews.com/  This test is not yet approved or cleared by the [place] FDA and  has been authorized for detection and/or diagnosis of SARS-CoV-2 by FDA under an Emergency Use Authorization (EUA). This EUA will remain  in effect (meaning this test can be used) for the duration of the COVID-19 declaration under Se ction 564(b)(1) of the Act, 21 U.S.C. section 360bbb-3(b)(1), unless the authorization is terminated or revoked sooner.  Performed at [facility], [place] [address] [place], [place] [address]   MRSA Next Gen by PCR, Nasal     Status: None   Collection Time: 01/18/21  4:25 PM   Specimen: Nasal Mucosa; Nasal Swab  Result Value Ref Range Status   MRSA by PCR Next Gen NOT DETECTED NOT DETECTED Final    Comment: (NOTE) The GeneXpert MRSA Assay (FDA approved for NASAL specimens only), is one component of a comprehensive MRSA colonization surveillance program. It is not intended to diagnose MRSA infection nor to guide or monitor treatment for MRSA infections. Test performance is not FDA approved in patients less than 68 years old. Performed at [facility], [place] [address], [place], [place] [address]          Radiology Studies: [facility] Chest Port 1 View  Result Date: 01/18/2021 CLINICAL DATA:  Shortness of breath EXAM: PORTABLE CHEST 1 VIEW COMPARISON:  01/12/2021 FINDINGS: Right Port-A-Cath remains in place, unchanged. Nodular airspace disease again noted throughout both lungs, worsening since prior study. This could reflect pneumonia, metastatic disease, or a combination. No visible effusions or pneumothorax. IMPRESSION: Worsening bilateral airspace disease, some of which appears nodular. This could reflect pneumonia, metastatic disease or a combination. Electronically Signed   By: Rolm Baptise M.D.   On: 01/18/2021 10:18         Scheduled Meds:  chlorhexidine  15 mL Mouth Rinse BID   Chlorhexidine Gluconate Cloth  6 each Topical Daily   enoxaparin (LOVENOX) injection  40 mg Subcutaneous Daily   escitalopram  10 mg Oral Daily   fentaNYL  1 patch  Transdermal Q72H   fluticasone furoate-vilanterol  1 puff Inhalation Daily   furosemide  40 mg Intravenous BID   ipratropium-albuterol  3 mL Nebulization Q4H   mouth rinse  15 mL Mouth Rinse q12n4p   pantoprazole (PROTONIX) IV  40 mg Intravenous Daily   potassium chloride  40 mEq Oral Once   senna-docusate  1 tablet Oral BID   Continuous Infusions:  sodium chloride Stopped (01/18/21 1833)     LOS: 1 day    Time spent: 40 minutes    Barb Merino, MD [facility] Pager [phone]

## 2021-01-19 NOTE — Consult Note (Signed)
Consultation Note Date: 01/19/2021   Patient Name: Chelsea Mcintosh  DOB: [birth year]  MRN: [id number]  Age / Sex: 60 y.o., female  PCP: System, Provider Not In Referring Physician: Barb Merino, MD  Reason for Consultation: Establishing goals of care  HPI/Patient Profile: 60 y.o. 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   Clinical Assessment and Goals of Care: I met today at [facility] bedside with herself and her mother, Resa Miner. Season is able to speak and participate but also receiving pain medication. Her voice is weak and it is difficult for her to remain on track with conversation. She is tearful at times talking about her family and being with her mother. She shows me pictures of her family and a trip to [place] she made a few years ago and this makes her smile and brings her happiness.   I spoke more with mother as nurse worked with Levada Dy although Tyquisha was present and listening throughout. Resa Miner expresses that family had a zoom call yesterday together and she expresses interest in hospice facility. I discussed further with [name] about hospice facility being a place that people go when we know time is limited to focus on comfort and symptom management. I further explained that hospice does not do labs or testing, send patients back to the hospital, or provide CPR/breathing machines if she declines but they would ensure her comfort and take good care of [name]. [name] expresses understanding and asks if they would provide oxygen and allow [name] to eat and I reassured her that they would. I also explained that even with high doses of oxygen through mask often this will not provide adequate relief for  patients as they progress and at this stage we provide medication to provide relief from labored breathing. [name] expresses understanding. They would like to continue to discuss this as a family. I offered to assist with conversation as desired with in person or tele conference. I provided my contact information to [place] and encouraged them to let me know how I can assist them with their decision making and support them through this process. We agree to speak again tomorrow.   All questions/concerns addressed. Emotional support provided.    Primary Decision Maker PATIENT     SUMMARY OF RECOMMENDATIONS   - Considering options moving forward - Mother voices potential interest in hospice facility - No changes to plan of care today  Code Status/Advance Care Planning: Full code   Symptom Management:  Continue liberal pain and anti-anxiety medication as needed.   Palliative Prophylaxis:  Bowel Regimen, Delirium Protocol, Frequent Pain Assessment, Oral Care, and Turn Reposition   Prognosis:  Prognosis very poor with advanced cancer. Likely < 2 weeks with progressive respiratory failure with known lung mets.   Discharge Planning: To Be Determined      Primary Diagnoses: Present on  Admission: **None**   I have reviewed the medical record, interviewed the patient and family, and examined the patient. The following aspects are pertinent.  Past Medical History:  Diagnosis Date   Ovarian cancer ([place])    Social History   Socioeconomic History   Marital status: Single    Spouse name: Not on file   Number of children: Not on file   Years of education: Not on file   Highest education level: Not on file  Occupational History   Not on file  Tobacco Use   Smoking status: Never   Smokeless tobacco: Never  Substance and Sexual Activity   Alcohol use: Never   Drug use: Never   Sexual activity: Not on file  Other Topics Concern   Not on file  Social History Narrative    Not on file   Social Determinants of Health   Financial Resource Strain: Not on file  Food Insecurity: Not on file  Transportation Needs: Not on file  Physical Activity: Not on file  Stress: Not on file  Social Connections: Not on file   Family History  Problem Relation Age of Onset   Cancer Sister    Breast cancer Sister    Stroke Maternal Grandmother    Heart attack Paternal Grandfather    Scheduled Meds:  chlorhexidine  15 mL Mouth Rinse BID   Chlorhexidine Gluconate Cloth  6 each Topical Daily   enoxaparin (LOVENOX) injection  40 mg Subcutaneous Daily   escitalopram  10 mg Oral Daily   fentaNYL  1 patch Transdermal Q72H   fluticasone furoate-vilanterol  1 puff Inhalation Daily   furosemide  40 mg Intravenous BID   ipratropium-albuterol  3 mL Nebulization Q4H   mouth rinse  15 mL Mouth Rinse q12n4p   pantoprazole (PROTONIX) IV  40 mg Intravenous Daily   potassium chloride  40 mEq Oral Once   senna-docusate  1 tablet Oral BID   Continuous Infusions:  sodium chloride Stopped (01/18/21 1833)   PRN Meds:.guaiFENesin, HYDROmorphone (DILAUDID) injection, ipratropium-albuterol, LORazepam, metoprolol tartrate, naloxone, ondansetron (ZOFRAN) IV, oxyCODONE, polyethylene glycol Allergies  Allergen Reactions   Vancomycin Itching   Flagyl [Metronidazole]     Pt states it made her "feel weird and nauseous"   Morphine Other (See Comments)    Mental status changes, hallucinations    Sulfasalazine     Severe burning sensation in the esophagus.   Tape     Burning sensation of the skin if left on too long   Review of Systems  Constitutional:  Positive for activity change, appetite change and fatigue.  Respiratory:  Positive for shortness of breath.   Neurological:  Positive for weakness.   Physical Exam Vitals and nursing note reviewed.  Constitutional:      General: She is not in acute distress.    Appearance: She is ill-appearing.  Cardiovascular:     Rate and Rhythm:  Tachycardia present.  Pulmonary:     Effort: No tachypnea, accessory muscle usage or respiratory distress.  Abdominal:     General: Abdomen is flat.  Neurological:     Mental Status: She is alert and oriented to person, place, and time.     Comments: Oriented although difficult to maintain on conversation at times (although this is likely due to recent pain medication and generalized fatigue)    Vital Signs: BP (!) 160/84 (BP Location: Right Arm)   Pulse (!) 107   Temp 97.7 F (36.5 C) (Axillary)   Resp (!) 25  Ht 5' 4" (1.626 m)   Wt 65.9 kg   SpO2 95%   BMI 24.94 kg/m  Pain Scale: 0-10   Pain Score: Asleep   SpO2: SpO2: 95 % O2 Device:SpO2: 95 % O2 Flow Rate: .O2 Flow Rate (L/min): 4 L/min  IO: Intake/output summary:  Intake/Output Summary (Last 24 hours) at 01/19/2021 1256 Last data filed at 01/19/2021 0400 Gross per 24 hour  Intake 763.59 ml  Output 1300 ml  Net -536.41 ml    LBM: Last BM Date:  (PTA) Baseline Weight: Weight: 65.9 kg Most recent weight: Weight: 65.9 kg     Palliative Assessment/Data:     Time In: 1315 Time Out: 1445 Time Total: 90 min Greater than 50%  of this time was spent counseling and coordinating care related to the above assessment and plan.  Signed by: Vinie Sill, NP Palliative Medicine Team Pager # [phone] (M-F 8a-5p) Team Phone # [phone] (Nights/Weekends)

## 2021-01-19 NOTE — Progress Notes (Signed)
I went to talk to patient and patient's mother at the bedside for follow-up regarding earlier discussion with palliative care and to evaluate the patient. Patient asked me for more pain medication, she thinks she got her fentanyl and oxycodone but did not get her Dilaudid and not having effective pain control. I talked to her mom in the room and outside the room.  I discussed that patient probably needs continuous pain medication infusions.  Plan: I offered patient intermittent injection, I offered them whether we should start patient on continuous pain medication through the IV line and in that case we will change her CODE STATUS to no CPR. Patient's mom asked me to keep the plan we are doing today as it is. She wants to explore whether she can go home with pain medication drip because she wants to keep her at her house as much as possible with plan to go to inpatient hospice if she cannot help her. Will discuss with palliative care and hospice tomorrow.

## 2021-01-20 DIAGNOSIS — C7800 Secondary malignant neoplasm of unspecified lung: Secondary | ICD-10-CM | POA: Diagnosis not present

## 2021-01-20 DIAGNOSIS — Z515 Encounter for palliative care: Secondary | ICD-10-CM | POA: Diagnosis not present

## 2021-01-20 DIAGNOSIS — G893 Neoplasm related pain (acute) (chronic): Secondary | ICD-10-CM | POA: Diagnosis not present

## 2021-01-20 DIAGNOSIS — Z7189 Other specified counseling: Secondary | ICD-10-CM | POA: Diagnosis not present

## 2021-01-20 DIAGNOSIS — R0902 Hypoxemia: Secondary | ICD-10-CM | POA: Diagnosis not present

## 2021-01-20 MED ORDER — HYDROMORPHONE HCL PF 10 MG/ML IJ SOLN
0.5000 mg/h | INTRAMUSCULAR | Status: DC
  Administered 2021-01-20: 0.5 mg/h via INTRAVENOUS
  Administered 2021-01-21 – 2021-01-25 (×6): 1.25 mg/h via INTRAVENOUS
  Filled 2021-01-20 (×11): qty 2.5

## 2021-01-20 MED ORDER — HYDROMORPHONE BOLUS VIA INFUSION
0.5000 mg | INTRAVENOUS | Status: DC | PRN
  Administered 2021-01-20 – 2021-01-27 (×37): 0.5 mg via INTRAVENOUS
  Filled 2021-01-20: qty 1

## 2021-01-20 MED ORDER — LIP MEDEX EX OINT
TOPICAL_OINTMENT | CUTANEOUS | Status: DC | PRN
  Administered 2021-01-23: 1 via TOPICAL
  Filled 2021-01-20 (×2): qty 7

## 2021-01-20 MED ORDER — POLYVINYL ALCOHOL 1.4 % OP SOLN
1.0000 [drp] | Freq: Three times a day (TID) | OPHTHALMIC | Status: DC
  Administered 2021-01-20 – 2021-01-27 (×19): 1 [drp] via OPHTHALMIC
  Filled 2021-01-20 (×2): qty 15

## 2021-01-20 MED ORDER — HYDROMORPHONE HCL 1 MG/ML IJ SOLN: 0.5000 mg | INTRAMUSCULAR | Status: AC | PRN

## 2021-01-20 NOTE — Progress Notes (Signed)
Palliative:  HPI: 60 y.o. 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   I met today at [place] bedside with herself and her mother. Chelsea Mcintosh continues to complain of pain and has been very uncomfortable most of the day. Mother, Chelsea Mcintosh, asks about the pain medication infusion as discussed with Chelsea Mcintosh yesterday. I discussed with them both that this is an option we can pursue. I told them that I want to be sure of expectations before I order the infusion. We discussed the side effects that could progress with potential decline and respiratory depression and we would proceed with narcan to reverse pain medication to improve her status but this would lead to worsening pain if they continued to want full intervention. Chelsea Mcintosh reports that she has experienced this before and would never want to go through this again. I discussed with them that in this case I would recommend DNR and they both agree that they do not want CPR or intubation. Chelsea Mcintosh reaffirms that Chelsea Mcintosh will receive oxygen and I explain that she will. They wish to continue with other interventions (IVF, IV medication and supportive measures) to prolong life but want to ensure comfort. We further discussed the benefits of hospice facility in having increased resources to ensure comfort while allowing her family and friends to come and go and be with her as much as possible. Chelsea Mcintosh seems very interested in pursuing [facility]. Chelsea Mcintosh seems open to the idea that this may be the best option for her. I did speak with Chelsea Mcintosh about what to expect for care if Chelsea Mcintosh went to [facility]. Chelsea Mcintosh expresses that she would want her good friend, Chelsea Mcintosh, and sister, Chelsea Mcintosh, involved in decisions.    Chelsea Mcintosh noted to be  HCPOA per notes in Care Everywhere) and sister Chelsea Mcintosh [phone] and [phone] noted to be secondary HCPOA). I contacted Chelsea Mcintosh and left voicemail but have not received a return call. Chelsea Mcintosh has been in contact with Chelsea Mcintosh and Chelsea Mcintosh and discussing plan. Chelsea Mcintosh shares that Chelsea Mcintosh will likely be traveling to be here and support Chelsea Mcintosh soon. Per Care Everywhere notes Chelsea Mcintosh was noted be involved in the previous conversations in [place] that resulted in decision for DNR, hospice, and comfort care.    All questions/concerns addressed. Emotional support provided.   Exam: Thin, frail. Ongoing pain distress. Breathing regular, unlabored. Abd flat. Moves all extremities.   Plan: - DNR decided.  - Will begin dilaudid 0.5 mg/hr infusion with 0.5 mg bolus every 30 min as needed for pain relief. Increase infusion and provide boluses conservatively but to provide tolerable relief.  - No further changes to medications or plan of care. Continue supportive care. Ongoing GOC discussion regarding potential transition to hospice facility.   37 min  Chelsea Sill, NP Palliative Medicine Team Pager [phone] (Please see [email or web address] for schedule) Team Phone [phone]    Greater than 50%  of this time was spent counseling and coordinating care related to the above assessment and plan

## 2021-01-20 NOTE — Progress Notes (Signed)
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  According to the hospice documentation, looks like the oxygen concentrator made too much noise. Patient herself was complaining of pain and shortness of breath.  In the emergency room blood pressure is stable.  Oxygen 70% on room air, started on high flow and switched to BiPAP and admitted to stepdown unit. Patient remains full code despite poor prognosis, limited life and hospice enrollment.   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 Continue all supportive care including oxygen, BiPAP as needed.  Adequate pain medications and anxiety medications. Appreciate palliative care seeing patient in the hospital. Patient is almost at end-of-life, if unable to be taken care at home, may benefit with residential hospice program.   Remains full code, will continue to discuss with patient's mother. Increased dose of fentanyl patch, increased dose of short acting oxycodone and Dilaudid injectable for severe cancer pain.  Patient with limited life expectancy, nearing death due to advanced cancer and extensive lung mets.  If she has to suffer from  cardiopulmonary arrest, CPR including chest compression resuscitation and intubation will be futile care.  Goal of care: Ongoing goal of care discussion with patient and patient's mother. I met with patient's mother 8/3 evening, she is coming back today after lunchtime to discuss further. Patient and patient's mother both note that she has terminal disease.  She has difficult to control pain.  They are exploring whether she can go home on Dilaudid infusion. Patient's mother also takes care of her advanced dementia husband, she may have a lot of caregiver burden but she does not want to event on her child.  She is also aware that patient may need to go to inpatient hospice if she is not able to help her. Will continue to discuss and coordinate with patient and her mother along with palliative care and hospice.   DVT prophylaxis: enoxaparin (LOVENOX) injection 40 mg Start: 01/18/21 1445   Code Status: Full code.  Goal of care discussion ongoing. Family Communication: None today.  Patient's mom on 8/3. Disposition Plan: Status is: Inpatient  Remains inpatient appropriate because:IV treatments appropriate due to intensity of illness or inability to take PO and Inpatient level of care appropriate due to severity of illness  Dispo: The patient is from: Home              Anticipated d/c is to:  home hospice  versus inpatient hospice.              Patient currently is not medically stable to d/c.  Only stable for hospice level of care.   Difficult to place patient No         Consultants:  Palliative medicine  Procedures:  None  Antimicrobials:  Augmentin 8/2-8/3   Subjective: Patient seen and examined today.  She was very happy today that she had a good night sleep after much adjustment of medications.  She was very appreciative of nursing staff and nighttime nurse who gave her timely pain medication and she could go to sleep.   Objective: Vitals:   01/20/21 0600 01/20/21 0700  01/20/21 0800 01/20/21 0810  BP: 113/66 132/65 (!) 142/86   Pulse: (!) 103 (!) 109 (!) 107   Resp: 12 (!) 23 18   Temp:      TempSrc:      SpO2: 95% 99% 100% 100%  Weight:      Height:        Intake/Output Summary (Last 24 hours) at 01/20/2021 0830 Last data filed at 01/20/2021 0200 Gross per 24 hour  Intake 772.99 ml  Output 1450 ml  Net -677.01 ml   Filed Weights   01/18/21 1615  Weight: 65.9 kg    Examination:  General exam: Patient appears in mild distress, mildly anxious but mostly composed today.  She has some impulsiveness and repeatedly says same thing.  Just received pain medications. Respiratory system: Poor bilateral air entry.  In moderate respiratory distress. SpO2: 100 % O2 Flow Rate (L/min): 4 L/min FiO2 (%): 36 %  Cardiovascular system: S1 & S2 heard, tachycardic. Gastrointestinal system: Soft.  Nontender.  Bowel sounds present.   Central nervous system: Alert and oriented.  Moves all extremities. Extremities: Symmetric 5 x 5 power.  Generalized weakness. Skin: No rashes, lesions or ulcers Psychiatry: Judgement and insight appear normal.  Mood is anxious.  Flat affect.    Data Reviewed: I have personally reviewed following labs and imaging studies  CBC: Recent Labs  Lab 01/14/21 0546 01/16/21 0456 01/18/21 1004  WBC 5.5 5.7 8.4  NEUTROABS  --  4.0  --   HGB 12.1 11.2* 12.0  HCT 39.6 37.2 39.6  MCV 90.0 91.2 90.8  PLT 274 284 517   Basic Metabolic Panel: Recent Labs  Lab 01/14/21 0546 01/16/21 0456 01/18/21 1004  NA 136 137 137  K 3.7 3.5 3.3*  CL 99 98 96*  CO2 28 29 30   GLUCOSE 98 90 113*  BUN 5* <5* 6*  CREATININE 0.58 0.61 0.41*  CALCIUM 8.7* 8.5* 8.7*  MG  --  1.8  --    GFR: Estimated Creatinine Clearance: 68.1 mL/min (A) (by C-G formula based on SCr of 0.41 mg/dL (L)). Liver Function Tests: Recent Labs  Lab 01/16/21 0456  AST 25  ALT 19  ALKPHOS 65  BILITOT 0.8  PROT 5.7*  ALBUMIN 2.1*   No results for input(s):  LIPASE, AMYLASE in the last 168 hours. No results for input(s): AMMONIA in the last 168 hours. Coagulation Profile: No results for input(s): INR, PROTIME in the last 168 hours.  Cardiac Enzymes: No results for input(s): CKTOTAL, CKMB, CKMBINDEX, TROPONINI in the last 168 hours. BNP (last 3 results) No results for input(s): PROBNP in the last 8760 hours. HbA1C: No results for input(s): HGBA1C in the last 72 hours. CBG: Recent Labs  Lab 01/14/21 1709 01/18/21 1631  GLUCAP 91 112*   Lipid Profile: No results for input(s): CHOL, HDL, LDLCALC, TRIG, CHOLHDL, LDLDIRECT in the last 72 hours. Thyroid Function Tests: No results for input(s): TSH, T4TOTAL, FREET4, T3FREE, THYROIDAB in the last 72 hours. Anemia Panel: No results for input(s): VITAMINB12, FOLATE, FERRITIN, TIBC, IRON, RETICCTPCT in the last 72 hours. Sepsis Labs: Recent Labs  Lab 01/14/21 [phone]  01/15/21 0441 01/18/21 1031 01/18/21 1231  PROCALCITON <0.10 <0.10  --   --   LATICACIDVEN  --   --  2.0* 1.3    Recent Results (from the past 240 hour(s))  Blood culture (routine x 2)     Status: None   Collection Time: 01/12/21 10:07 PM   Specimen: BLOOD  Result Value Ref Range Status   Specimen Description BLOOD LEFT ANTECUBITAL  Final   Special Requests   Final    BOTTLES DRAWN AEROBIC AND ANAEROBIC Blood Culture adequate volume   Culture   Final    NO GROWTH 5 DAYS Performed at [facility], [place] [address] [place], [place] [address]    Report Status 01/18/2021 FINAL  Final  Blood culture (routine x 2)     Status: None   Collection Time: 01/12/21 10:07 PM   Specimen: BLOOD RIGHT ARM  Result Value Ref Range Status   Specimen Description BLOOD RIGHT ARM  Final   Special Requests   Final    BOTTLES DRAWN AEROBIC ONLY Blood Culture adequate volume   Culture   Final    NO GROWTH 5 DAYS Performed at [facility], [place] [address], [place], [place] [address]    Report Status 01/18/2021 FINAL  Final  Resp  Panel by RT-PCR (Flu A&B, Covid) Nasopharyngeal Swab     Status: None   Collection Time: 01/13/21 12:38 AM   Specimen: Nasopharyngeal Swab; Nasopharyngeal(NP) swabs in vial transport medium  Result Value Ref Range Status   SARS Coronavirus 2 by RT PCR NEGATIVE NEGATIVE Final    Comment: (NOTE) SARS-CoV-2 target nucleic acids are NOT DETECTED.  The SARS-CoV-2 RNA is generally detectable in upper respiratory specimens during the acute phase of infection. The lowest concentration of SARS-CoV-2 viral copies this assay can detect is 138 copies/mL. A negative result does not preclude SARS-Cov-2 infection and should not be used as the sole basis for treatment or other patient management decisions. A negative result may occur with  improper specimen collection/handling, submission of specimen other than nasopharyngeal swab, presence of viral mutation(s) within the areas targeted by this assay, and inadequate number of viral copies(<138 copies/mL). A negative result must be combined with clinical observations, patient history, and epidemiological information. The expected result is Negative.  Fact Sheet for Patients:  EntrepreneurPulse.com.au  Fact Sheet for Healthcare Providers:  IncredibleEmployment.be  This test is no t yet approved or cleared by the [place] FDA and  has been authorized for detection and/or diagnosis of SARS-CoV-2 by FDA under an Emergency Use Authorization (EUA). This EUA will remain  in effect (meaning this test can be used) for the duration of the COVID-19 declaration under Section 564(b)(1) of the Act, 21 U.S.C.section 360bbb-3(b)(1), unless the authorization is terminated  or revoked sooner.       Influenza A by PCR NEGATIVE NEGATIVE Final   Influenza B by PCR NEGATIVE NEGATIVE Final    Comment: (NOTE) The Xpert Xpress SARS-CoV-2/FLU/RSV plus assay is intended as an aid in the diagnosis of influenza from Nasopharyngeal  swab specimens and should not be used as a sole basis for treatment. Nasal washings and aspirates are unacceptable for Xpert Xpress SARS-CoV-2/FLU/RSV testing.  Fact Sheet for Patients: EntrepreneurPulse.com.au  Fact Sheet for Healthcare Providers: IncredibleEmployment.be  This test is not yet approved or cleared by the [place] FDA and has been authorized for detection and/or diagnosis of SARS-CoV-2 by FDA under an Emergency Use Authorization (EUA). This EUA will remain  in effect (meaning this test can be used) for the duration of the COVID-19 declaration under Section 564(b)(1) of the Act, 21 U.S.C. section 360bbb-3(b)(1), unless the authorization is terminated or revoked.  Performed at [facility], [place] [address] [place], [place] [address]   MRSA Next Gen by PCR, Nasal     Status: None   Collection Time: 01/14/21 10:22 PM   Specimen: Nasal Mucosa; Nasal Swab  Result Value Ref Range Status   MRSA by PCR Next Gen NOT DETECTED NOT DETECTED Final    Comment: (NOTE) The GeneXpert MRSA Assay (FDA approved for NASAL specimens only), is one component of a comprehensive MRSA colonization surveillance program. It is not intended to diagnose MRSA infection nor to guide or monitor treatment for MRSA infections. Test performance is not FDA approved in patients less than 69 years old. Performed at [facility], [place] [address] [place], [place] [address]   SARS CORONAVIRUS 2 (TAT 6-24 HRS) Nasopharyngeal Nasopharyngeal Swab     Status: None   Collection Time: 01/18/21 12:50 PM   Specimen: Nasopharyngeal Swab  Result Value Ref Range Status   SARS Coronavirus 2 NEGATIVE NEGATIVE Final    Comment: (NOTE) SARS-CoV-2 target nucleic acids are NOT DETECTED.  The SARS-CoV-2 RNA is generally detectable in upper and lower respiratory specimens during the acute phase of infection. Negative results do not preclude SARS-CoV-2 infection, do  not rule out co-infections with other pathogens, and should not be used as the sole basis for treatment or other patient management decisions. Negative results must be combined with clinical observations, patient history, and epidemiological information. The expected result is Negative.  Fact Sheet for Patients: SugarRoll.be  Fact Sheet for Healthcare Providers: https://www.woods-mathews.com/  This test is not yet approved or cleared by the [place] FDA and  has been authorized for detection and/or diagnosis of SARS-CoV-2 by FDA under an Emergency Use Authorization (EUA). This EUA will remain  in effect (meaning this test can be used) for the duration of the COVID-19 declaration under Se ction 564(b)(1) of the Act, 21 U.S.C. section 360bbb-3(b)(1), unless the authorization is terminated or revoked sooner.  Performed at [facility], [place] [address] [place], [place] [address]   MRSA Next Gen by PCR, Nasal     Status: None   Collection Time: 01/18/21  4:25 PM   Specimen: Nasal Mucosa; Nasal Swab  Result Value Ref Range Status   MRSA by PCR Next Gen NOT DETECTED NOT DETECTED Final    Comment: (NOTE) The GeneXpert MRSA Assay (FDA approved for NASAL specimens only), is one component of a comprehensive MRSA colonization surveillance program. It is not intended to diagnose MRSA infection nor to guide or monitor treatment for MRSA infections. Test performance is not FDA approved in patients less than 71 years old. Performed at [facility], [place] [address], [place], [place] [address]          Radiology Studies: [facility] Chest Port 1 View  Result Date: 01/18/2021 CLINICAL DATA:  Shortness of breath EXAM: PORTABLE CHEST 1 VIEW COMPARISON:  01/12/2021 FINDINGS: Right Port-A-Cath remains in place, unchanged. Nodular airspace disease again noted throughout both lungs, worsening since prior study. This could reflect  pneumonia, metastatic disease, or a combination. No visible effusions or pneumothorax. IMPRESSION: Worsening bilateral airspace disease, some of which appears nodular. This could reflect pneumonia, metastatic disease or a combination. Electronically Signed   By: Rolm Baptise M.D.   On: 01/18/2021 10:18  Scheduled Meds:  chlorhexidine  15 mL Mouth Rinse BID   Chlorhexidine Gluconate Cloth  6 each Topical Daily   enoxaparin (LOVENOX) injection  40 mg Subcutaneous Daily   escitalopram  10 mg Oral Daily   fentaNYL  1 patch Transdermal Q72H   fluticasone furoate-vilanterol  1 puff Inhalation Daily   furosemide  40 mg Intravenous BID   ipratropium-albuterol  3 mL Nebulization QID   mouth rinse  15 mL Mouth Rinse q12n4p   pantoprazole (PROTONIX) IV  40 mg Intravenous Daily   potassium chloride  40 mEq Oral Once   senna-docusate  1 tablet Oral BID   Continuous Infusions:  sodium chloride 1,000 mL (01/19/21 1948)     LOS: 2 days    Time spent: 40 minutes    Barb Merino, MD [facility] Pager [phone]

## 2021-01-21 DIAGNOSIS — J9601 Acute respiratory failure with hypoxia: Secondary | ICD-10-CM | POA: Diagnosis not present

## 2021-01-21 DIAGNOSIS — Z66 Do not resuscitate: Secondary | ICD-10-CM

## 2021-01-21 MED ORDER — HALOPERIDOL LACTATE 2 MG/ML PO CONC
0.5000 mg | ORAL | Status: DC | PRN
  Filled 2021-01-21: qty 0.3

## 2021-01-21 MED ORDER — GLYCOPYRROLATE 1 MG PO TABS: 1.0000 mg | ORAL_TABLET | ORAL | Status: DC | PRN

## 2021-01-21 MED ORDER — HALOPERIDOL LACTATE 5 MG/ML IJ SOLN
2.0000 mg | Freq: Four times a day (QID) | INTRAMUSCULAR | Status: DC | PRN
  Administered 2021-01-21: 2 mg via INTRAVENOUS
  Filled 2021-01-21: qty 1

## 2021-01-21 MED ORDER — HALOPERIDOL 0.5 MG PO TABS: 0.5000 mg | ORAL_TABLET | ORAL | Status: DC | PRN

## 2021-01-21 MED ORDER — SODIUM CHLORIDE 0.9% FLUSH: 10.0000 mL | INTRAVENOUS | Status: DC | PRN

## 2021-01-21 MED ORDER — BIOTENE DRY MOUTH MT LIQD: 15.0000 mL | OROMUCOSAL | Status: DC | PRN

## 2021-01-21 MED ORDER — GLYCOPYRROLATE 0.2 MG/ML IJ SOLN: 0.2000 mg | INTRAMUSCULAR | Status: DC | PRN

## 2021-01-21 MED ORDER — HALOPERIDOL LACTATE 5 MG/ML IJ SOLN
0.5000 mg | INTRAMUSCULAR | Status: DC | PRN
  Administered 2021-01-24 – 2021-01-27 (×5): 0.5 mg via INTRAVENOUS
  Filled 2021-01-21 (×5): qty 1

## 2021-01-21 MED ORDER — GLYCOPYRROLATE 0.2 MG/ML IJ SOLN
0.2000 mg | INTRAMUSCULAR | Status: DC | PRN
  Administered 2021-01-26: 0.2 mg via INTRAVENOUS
  Filled 2021-01-21 (×2): qty 1

## 2021-01-21 MED ORDER — ACETAMINOPHEN 325 MG PO TABS: 650.0000 mg | ORAL_TABLET | Freq: Four times a day (QID) | ORAL | Status: DC | PRN

## 2021-01-21 MED ORDER — LORAZEPAM 2 MG/ML IJ SOLN
1.0000 mg | INTRAMUSCULAR | Status: DC | PRN
  Administered 2021-01-22 – 2021-01-26 (×17): 1 mg via INTRAVENOUS
  Filled 2021-01-21 (×17): qty 1

## 2021-01-21 MED ORDER — METHYLNALTREXONE BROMIDE 12 MG/0.6ML ~~LOC~~ SOLN
12.0000 mg | Freq: Once | SUBCUTANEOUS | Status: AC
  Administered 2021-01-21: 12 mg via SUBCUTANEOUS
  Filled 2021-01-21: qty 0.6

## 2021-01-21 MED ORDER — ACETAMINOPHEN 650 MG RE SUPP: 650.0000 mg | Freq: Four times a day (QID) | RECTAL | Status: DC | PRN

## 2021-01-21 NOTE — TOC Progression Note (Signed)
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    Expected Discharge Plan: Home w Hospice Care Barriers to Discharge: Continued Medical Work up  Expected Discharge Plan and Services Expected Discharge Plan: [place]   Discharge Planning Services: CM Consult   Living arrangements for the past 2 months: Single Family Home Expected Discharge Date:  (unknown)                                     Social Determinants of Health (SDOH) Interventions    Readmission Risk Interventions No flowsheet data found.

## 2021-01-21 NOTE — Progress Notes (Signed)
Palliative Medicine Inpatient Follow Up Note  HPI: 60 y.o. 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  Today's Discussion (01/21/2021):  *Please note that this is a verbal dictation therefore any spelling or grammatical errors are due to the "[employer]" system interpretation.  Family Meeting  Family Present: Chelsea Mcintosh (patient) &  Chelsea Mcintosh (Mcintosh)  Providers Present: Chelsea Mcintosh, [facility]  I met with Chelsea Mcintosh and Chelsea Mcintosh at bedside.We reviewed her present medical conditions, most notably her metastatic ovarian cancer. We discussed the prior hopes for improvements and the now realization that her condition is incurable. Reviewed what Chelsea Mcintosh's wishes at this time are which are for her to no longer be in pain, to no longer be anxious, and to ideally rest in a better place. Chelsea Mcintosh is a very devout Protestant.  Chelsea Mcintosh endorses that she too realizes Chelsea Mcintosh is not going recover from her present conditions. While at bedside Chelsea Mcintosh's son., Chelsea Mcintosh was able to call and speak with her. Chelsea Mcintosh remains in [place] and is noted to not be reliable as he has two children himself and is an alcoholic. Chelsea Mcintosh though appeared happy to exchange with him.   We reviewed that Chelsea Mcintosh has a large circle of friends who would like to see and spend time with her. Chelsea Mcintosh shares that she does not want too many people to visit at once to help Chelsea Mcintosh from becoming overwhelmed. We agreed that it should be for Chelsea Mcintosh to decide who should or should not visit.  We reviewed the medications Chelsea Mcintosh is on for pain and dyspnea. I shared that per my understanding our goals at this time are to keep Chelsea Mcintosh as comfortable and symptom free as possible. We  talked about transition to comfort measures in house and what that would entail inclusive of medications to control pain, dyspnea, agitation, nausea, itching, and hiccups.  We discussed stopping all uneccessary measures such as blood draws, glucose needle sticks, and frequent vital signs.   We further reviewed that with this goal of comfort in mind transitioning to an inpatient hospice would certainly be reasonable. I shared that often these facilities can better accommodate patient and family needs. I also alluded to the reality that these environments feel far more like a home than a hospital. Chelsea Mcintosh consent to this. They would prefer [facility].  Questions and concerns addressed   Objective Assessment: Vital Signs Vitals:   01/21/21 1100 01/21/21 1129  BP:    Pulse:    Resp:    Temp:  (!) 97 F (36.1 C)  SpO2: 94%     Intake/Output Summary (Last 24 hours) at 01/21/2021 1409 Last data filed at 01/21/2021 1317 Gross per 24 hour  Intake 2948.16 ml  Output 900 ml  Net 2048.16 ml   Last Weight  Most recent update: 01/18/2021  4:29 PM    Weight  65.9 kg (145 lb 4.5 oz)            Gen:  Very ill appearing AA F in NAD HEENT: moist mucous membranes CV: Irregular rate and rhythm PULM: 2LPM [place] FEO:[id number] in place EXT: No edema Neuro: Alert and oriented x2 is drowsy this afternoon  SUMMARY OF RECOMMENDATIONS   DNAR/DNI  Comfort Care - Continue dilaudid gtt  Plan for transition to [facility] when a bed is available  Chaplain for prayer  Ongoing PMT support  Time Spent: 70 Greater than 50% of the time was spent in counseling and coordination of care ______________________________________________________________________________________ [place] Team Team Cell Phone: [phone] Please utilize secure chat with additional questions, if there is no response within 30 minutes please call the above phone  number  Palliative Medicine Team providers are available by phone from 7am to 7pm daily and can be reached through the team cell phone.  Should this patient require assistance outside of these hours, please call the patient's attending physician.

## 2021-01-21 NOTE — Progress Notes (Signed)
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  Patient herself was complaining of pain and shortness of breath.  In the emergency room blood pressure is stable.  Oxygen 70% on room air, started on high flow and switched to BiPAP and admitted to stepdown unit. Patient currently remains on Dilaudid infusion.  Plan to transition to hospice, possibly inpatient hospice.   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 Severe constipation related to opioids.  Plan: Currently with comfort care in the hospital, oxygen.  BiPAP as needed for comfort. Pain is not controlled with traditional intermittent injections, now started on Dilaudid infusion with 0.5 mg/h and intermittent boluses.  On Ativan as needed. Patient with no bowel movement since 1 week, will give 1 dose of methylnaltrexone today. Ongoing transition of care discussion.  Anticipate discharge to hospice home due to ongoing need for higher level of care. DNR/DNI.   DVT prophylaxis: enoxaparin (LOVENOX) injection 40 mg Start: 01/18/21 1445   Code Status: DNR/DNI. Family Communication: None today. Disposition Plan: Status is: Inpatient  Remains inpatient appropriate because:IV treatments appropriate due to intensity of illness or inability  to take PO and Inpatient level of care appropriate due to severity of illness  Dispo: The patient is from: Home              Anticipated d/c is to:  home hospice  versus inpatient hospice.              Patient currently is not medically stable to d/c.  Only stable for hospice level of care.   Difficult to place patient No     Consultants:  Palliative medicine  Procedures:  None  Antimicrobials:  Augmentin 8/2-8/3   Subjective: Patient seen and examined.  Patient stated she got good night sleep.  Sleepy and lethargic.  When she wakes up she tells me that she hurts and wants nobody to go through it.  We gave her more medications for comfort. Blood pressure cuff from the monitor hurts her arm.  We will discontinue continuous monitoring. No family at bedside.  Used 2 boluses of Dilaudid last night. Patient has not had any output on her colostomy since 1 week, will use 1 dose of naltrexone.   Objective: Vitals:   01/21/21 0500 01/21/21 0627 01/21/21 0736 01/21/21 0800  BP:  138/73  127/74  Pulse: (!) 104 (!) 116  (!) 111  Resp: (!) '7 19  14  '$ Temp:   97.8 F (36.6 C)   TempSrc:   Axillary   SpO2: (!) 89% 96% 96% 94%  Weight:      Height:        Intake/Output Summary (Last 24 hours) at 01/21/2021 1033 Last data filed at 01/21/2021 0800 Gross per 24 hour  Intake 2977.9 ml  Output 1200 ml  Net 1777.9 ml  Filed Weights   01/18/21 1615  Weight: 65.9 kg    Examination:  General: Sick looking, frail and debilitated and slightly disoriented today. Cardiovascular: S1-S2 normal.  Tachycardic.  Regular rate and rhythm. Respiratory: Bilateral clear.  Poor air entry.  Currently on 3 L oxygen. Gastrointestinal: Soft but distended.  Mild diffuse tenderness present.  Left lower quadrant colostomy present but no stool.  Bowel sounds present.    Data Reviewed: I have personally reviewed following labs and imaging studies  CBC: Recent Labs  Lab 01/16/21 0456 01/18/21 1004   WBC 5.7 8.4  NEUTROABS 4.0  --   HGB 11.2* 12.0  HCT 37.2 39.6  MCV 91.2 90.8  PLT 284 [id number]   Basic Metabolic Panel: Recent Labs  Lab 01/16/21 0456 01/18/21 1004  NA 137 137  K 3.5 3.3*  CL 98 96*  CO2 29 30  GLUCOSE 90 113*  BUN <5* 6*  CREATININE 0.61 0.41*  CALCIUM 8.5* 8.7*  MG 1.8  --    GFR: Estimated Creatinine Clearance: 68.1 mL/min (A) (by C-G formula based on SCr of 0.41 mg/dL (L)). Liver Function Tests: Recent Labs  Lab 01/16/21 0456  AST 25  ALT 19  ALKPHOS 65  BILITOT 0.8  PROT 5.7*  ALBUMIN 2.1*   No results for input(s): LIPASE, AMYLASE in the last 168 hours. No results for input(s): AMMONIA in the last 168 hours. Coagulation Profile: No results for input(s): INR, PROTIME in the last 168 hours.  Cardiac Enzymes: No results for input(s): CKTOTAL, CKMB, CKMBINDEX, TROPONINI in the last 168 hours. BNP (last 3 results) No results for input(s): PROBNP in the last 8760 hours. HbA1C: No results for input(s): HGBA1C in the last 72 hours. CBG: Recent Labs  Lab 01/14/21 1709 01/18/21 1631  GLUCAP 91 112*   Lipid Profile: No results for input(s): CHOL, HDL, LDLCALC, TRIG, CHOLHDL, LDLDIRECT in the last 72 hours. Thyroid Function Tests: No results for input(s): TSH, T4TOTAL, FREET4, T3FREE, THYROIDAB in the last 72 hours. Anemia Panel: No results for input(s): VITAMINB12, FOLATE, FERRITIN, TIBC, IRON, RETICCTPCT in the last 72 hours. Sepsis Labs: Recent Labs  Lab 01/15/21 0441 01/18/21 1031 01/18/21 1231  PROCALCITON <0.10  --   --   LATICACIDVEN  --  2.0* 1.3    Recent Results (from the past 240 hour(s))  Blood culture (routine x 2)     Status: None   Collection Time: 01/12/21 10:07 PM   Specimen: BLOOD  Result Value Ref Range Status   Specimen Description BLOOD LEFT ANTECUBITAL  Final   Special Requests   Final    BOTTLES DRAWN AEROBIC AND ANAEROBIC Blood Culture adequate volume   Culture   Final    NO GROWTH 5 DAYS Performed at  [facility], [address], [place], [place] [address]    Report Status 01/18/2021 FINAL  Final  Blood culture (routine x 2)     Status: None   Collection Time: 01/12/21 10:07 PM   Specimen: BLOOD RIGHT ARM  Result Value Ref Range Status   Specimen Description BLOOD RIGHT ARM  Final   Special Requests   Final    BOTTLES DRAWN AEROBIC ONLY Blood Culture adequate volume   Culture   Final    NO GROWTH 5 DAYS Performed at [facility], [place] [address] [place], [place] [address]    Report Status 01/18/2021 FINAL  Final  Resp Panel by RT-PCR (Flu A&B, Covid) Nasopharyngeal Swab     Status:  None   Collection Time: 01/13/21 12:38 AM   Specimen: Nasopharyngeal Swab; Nasopharyngeal(NP) swabs in vial transport medium  Result Value Ref Range Status   SARS Coronavirus 2 by RT PCR NEGATIVE NEGATIVE Final    Comment: (NOTE) SARS-CoV-2 target nucleic acids are NOT DETECTED.  The SARS-CoV-2 RNA is generally detectable in upper respiratory specimens during the acute phase of infection. The lowest concentration of SARS-CoV-2 viral copies this assay can detect is 138 copies/mL. A negative result does not preclude SARS-Cov-2 infection and should not be used as the sole basis for treatment or other patient management decisions. A negative result may occur with  improper specimen collection/handling, submission of specimen other than nasopharyngeal swab, presence of viral mutation(s) within the areas targeted by this assay, and inadequate number of viral copies(<138 copies/mL). A negative result must be combined with clinical observations, patient history, and epidemiological information. The expected result is Negative.  Fact Sheet for Patients:  EntrepreneurPulse.com.au  Fact Sheet for Healthcare Providers:  IncredibleEmployment.be  This test is no t yet approved or cleared by the [place] FDA and  has been authorized for detection and/or  diagnosis of SARS-CoV-2 by FDA under an Emergency Use Authorization (EUA). This EUA will remain  in effect (meaning this test can be used) for the duration of the COVID-19 declaration under Section 564(b)(1) of the Act, 21 U.S.C.section 360bbb-3(b)(1), unless the authorization is terminated  or revoked sooner.       Influenza A by PCR NEGATIVE NEGATIVE Final   Influenza B by PCR NEGATIVE NEGATIVE Final    Comment: (NOTE) The Xpert Xpress SARS-CoV-2/FLU/RSV plus assay is intended as an aid in the diagnosis of influenza from Nasopharyngeal swab specimens and should not be used as a sole basis for treatment. Nasal washings and aspirates are unacceptable for Xpert Xpress SARS-CoV-2/FLU/RSV testing.  Fact Sheet for Patients: EntrepreneurPulse.com.au  Fact Sheet for Healthcare Providers: IncredibleEmployment.be  This test is not yet approved or cleared by the [place] FDA and has been authorized for detection and/or diagnosis of SARS-CoV-2 by FDA under an Emergency Use Authorization (EUA). This EUA will remain in effect (meaning this test can be used) for the duration of the COVID-19 declaration under Section 564(b)(1) of the Act, 21 U.S.C. section 360bbb-3(b)(1), unless the authorization is terminated or revoked.  Performed at [facility], [place] [address], [place], [place] [address]   MRSA Next Gen by PCR, Nasal     Status: None   Collection Time: 01/14/21 10:22 PM   Specimen: Nasal Mucosa; Nasal Swab  Result Value Ref Range Status   MRSA by PCR Next Gen NOT DETECTED NOT DETECTED Final    Comment: (NOTE) The GeneXpert MRSA Assay (FDA approved for NASAL specimens only), is one component of a comprehensive MRSA colonization surveillance program. It is not intended to diagnose MRSA infection nor to guide or monitor treatment for MRSA infections. Test performance is not FDA approved in patients less than 50 years old. Performed at  [facility], [place] [address], [place], [place] [address]   SARS CORONAVIRUS 2 (TAT 6-24 HRS) Nasopharyngeal Nasopharyngeal Swab     Status: None   Collection Time: 01/18/21 12:50 PM   Specimen: Nasopharyngeal Swab  Result Value Ref Range Status   SARS Coronavirus 2 NEGATIVE NEGATIVE Final    Comment: (NOTE) SARS-CoV-2 target nucleic acids are NOT DETECTED.  The SARS-CoV-2 RNA is generally detectable in upper and lower respiratory specimens during the acute phase of infection. Negative results  do not preclude SARS-CoV-2 infection, do not rule out co-infections with other pathogens, and should not be used as the sole basis for treatment or other patient management decisions. Negative results must be combined with clinical observations, patient history, and epidemiological information. The expected result is Negative.  Fact Sheet for Patients: SugarRoll.be  Fact Sheet for Healthcare Providers: https://www.woods-mathews.com/  This test is not yet approved or cleared by the [place] FDA and  has been authorized for detection and/or diagnosis of SARS-CoV-2 by FDA under an Emergency Use Authorization (EUA). This EUA will remain  in effect (meaning this test can be used) for the duration of the COVID-19 declaration under Se ction 564(b)(1) of the Act, 21 U.S.C. section 360bbb-3(b)(1), unless the authorization is terminated or revoked sooner.  Performed at [facility], [place] [address], [place], [place] [address]   MRSA Next Gen by PCR, Nasal     Status: None   Collection Time: 01/18/21  4:25 PM   Specimen: Nasal Mucosa; Nasal Swab  Result Value Ref Range Status   MRSA by PCR Next Gen NOT DETECTED NOT DETECTED Final    Comment: (NOTE) The GeneXpert MRSA Assay (FDA approved for NASAL specimens only), is one component of a comprehensive MRSA colonization surveillance program. It is not intended to diagnose MRSA infection nor to  guide or monitor treatment for MRSA infections. Test performance is not FDA approved in patients less than 72 years old. Performed at [facility], [place] [address] [place], [place] [address]          Radiology Studies: No results found.      Scheduled Meds:  chlorhexidine  15 mL Mouth Rinse BID   Chlorhexidine Gluconate Cloth  6 each Topical Daily   enoxaparin (LOVENOX) injection  40 mg Subcutaneous Daily   escitalopram  10 mg Oral Daily   fentaNYL  1 patch Transdermal Q72H   fluticasone furoate-vilanterol  1 puff Inhalation Daily   furosemide  40 mg Intravenous BID   ipratropium-albuterol  3 mL Nebulization QID   mouth rinse  15 mL Mouth Rinse q12n4p   methylnaltrexone  12 mg Subcutaneous Once   pantoprazole (PROTONIX) IV  40 mg Intravenous Daily   polyvinyl alcohol  1 drop Both Eyes TID   potassium chloride  40 mEq Oral Once   senna-docusate  1 tablet Oral BID   Continuous Infusions:  HYDROmorphone 0.5 mg/hr (01/21/21 0800)     LOS: 3 days    Time spent: 32 minutes    Barb Merino, MD [facility] Pager [phone]

## 2021-01-21 NOTE — Progress Notes (Addendum)
[facility] [address] [facility]) Hospital Liaison RN Note  Referral received from Velva Harman, RN [place] for patient/family interest in [facility]. Chart and patient information reviewed by [facility] physician. [facility] eligibility confirmed.  Spoke with patient's mother, Resa Miner to confirm interest and explain services. Unfortunately [facility] is unable to offer a room today. Family and TOC aware hospital liaison will follow up tomorrow or sooner if a room becomes available.   Please do not hesitate to call with any hospice related questions or concerns.   Thank you for the opportunity to participate in this patient's care.   Bobbie "Loren Racer, RN, BSN [facility] Liaison [phone]

## 2021-01-21 NOTE — Progress Notes (Addendum)
Pt removed off of telemetry monitoring & bp cuff removed per Dr. Edmonia Caprio request.

## 2021-01-21 NOTE — TOC Progression Note (Signed)
Transition of Care [facility]) - Progression Note    Patient Details  Name: DIANARA FORSEY MRN: [id number] Date of Birth: [birth year]  Transition of Care [facility]) CM/SW Contact  Leeroy Cha, RN Phone Number: 01/21/2021, 1:49 PM  Clinical Narrative:    [name] with [employer] is aware of patient and residential hospice needs   Expected Discharge Plan: Home w Hospice Care Barriers to Discharge: Continued Medical Work up  Expected Discharge Plan and Services Expected Discharge Plan: [place]   Discharge Planning Services: CM Consult   Living arrangements for the past 2 months: Single Family Home Expected Discharge Date:  (unknown)                                     Social Determinants of Health (SDOH) Interventions    Readmission Risk Interventions No flowsheet data found.

## 2021-01-21 NOTE — Plan of Care (Signed)

## 2021-01-22 MED ORDER — IPRATROPIUM-ALBUTEROL 0.5-2.5 (3) MG/3ML IN SOLN
3.0000 mL | Freq: Three times a day (TID) | RESPIRATORY_TRACT | Status: DC
  Administered 2021-01-22 – 2021-01-27 (×14): 3 mL via RESPIRATORY_TRACT
  Filled 2021-01-22 (×14): qty 3

## 2021-01-22 NOTE — Progress Notes (Signed)
I received a consult for support for Chelsea Mcintosh and her mother for end of life care.  I spoke with them briefly, but [name] was very sleepy. She was appreciative of the visit but asked if I could come back another time.  Her mother requested that I hold them in prayer.  We will attempt follow up at a later time, but please also page as needs arise.  Lindsey, Bcc Pager, [phone] 12:36 PM

## 2021-01-22 NOTE — Progress Notes (Signed)
Daily Progress Note   Patient Name: Chelsea Mcintosh       Date: 01/22/2021 DOB: 1961/01/11  Age: 60 y.o. MRN#: [id number] Attending Physician: Barb Merino, MD Primary Care Physician: System, Provider Not In Admit Date: 01/18/2021  Reason for Consultation/Follow-up: Terminal Care  Subjective:  Awake, resting in bed, took 1-2 bites of applesauce from her breakfast tray. Is on Dilaudid drip, bedside RN in the room.   Length of Stay: 4  Current Medications: Scheduled Meds:   chlorhexidine  15 mL Mouth Rinse BID   escitalopram  10 mg Oral Daily   fentaNYL  1 patch Transdermal Q72H   fluticasone furoate-vilanterol  1 puff Inhalation Daily   furosemide  40 mg Intravenous BID   ipratropium-albuterol  3 mL Nebulization QID   mouth rinse  15 mL Mouth Rinse q12n4p   polyvinyl alcohol  1 drop Both Eyes TID   senna-docusate  1 tablet Oral BID    Continuous Infusions:  HYDROmorphone 1.25 mg/hr (01/21/21 1600)    PRN Meds: acetaminophen **OR** acetaminophen, antiseptic oral rinse, glycopyrrolate **OR** glycopyrrolate **OR** glycopyrrolate, guaiFENesin, haloperidol **OR** haloperidol **OR** haloperidol lactate, HYDROmorphone, ipratropium-albuterol, lip balm, LORazepam, ondansetron (ZOFRAN) IV  Physical Exam         Awake  Appears with generalized weakness Mild distress although she denies pain, ill appearing Has colostomy Shallow breath sounds  Vital Signs: BP 103/82 (BP Location: Right Arm)   Pulse 94   Temp 98.1 F (36.7 C) (Oral)   Resp 16   Ht '5\' 4"'$  (1.626 m)   Wt 65.9 kg   SpO2 97%   BMI 24.94 kg/m  SpO2: SpO2: 97 % O2 Device: O2 Device: Nasal Cannula O2 Flow Rate: O2 Flow Rate (L/min): 4 L/min  Intake/output summary:  Intake/Output Summary (Last 24 hours) at 01/22/2021  1106 Last data filed at 01/22/2021 1037 Gross per 24 hour  Intake 498.82 ml  Output 1250 ml  Net -751.18 ml   LBM: Last BM Date:  (PTA) Baseline Weight: Weight: 65.9 kg Most recent weight: Weight: 65.9 kg      PPS 30% Palliative Assessment/Data:      Patient Active Problem List   Diagnosis Date Noted   End of life care 01/20/2021   Cancer associated pain 01/19/2021   Acute hypoxemic respiratory failure ([place]) 01/19/2021  Hypoxia 01/18/2021   Metastatic malignant neoplasm (HCC)    HCAP (healthcare-associated pneumonia) 01/13/2021    Palliative Care Assessment & Plan   Patient Profile:    Assessment:  60 y.o. 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  now on comfort measures.   Recommendations/Plan:  DNR DNI Comfort care Continue Dilaudid drip, titrate to comfort.  Awaiting [facility] bed.   Goals of Care and Additional Recommendations: Limitations on Scope of Treatment: Full Comfort Care  Code Status:    Code Status Orders  (From admission, onward)           Start     Ordered   01/21/21 1350  Do not attempt resuscitation (DNR)  Continuous       Question Answer Comment  In the event of cardiac or respiratory ARREST Do not call a "code blue"   In the event of cardiac or respiratory ARREST Do not perform Intubation, CPR, defibrillation or ACLS   In the event of cardiac or respiratory ARREST Use medication by any route, position, wound care, and other measures to relive pain and suffering. May use oxygen, suction and manual treatment of airway obstruction as needed for comfort.      01/21/21 1350           Code Status History     Date Active Date Inactive Code Status Order ID  Comments User Context   01/20/2021 1431 01/21/2021 1350 DNR [id number]  Pershing Proud, NP Inpatient   01/18/2021 1420 01/20/2021 1430 Full Code [id number]  Lequita Halt, MD ED   01/13/2021 0214 01/17/2021 2025 Full Code [id number]  Kayleen Memos, DO ED       Prognosis:  < 2 weeks  Discharge Planning: Hospice facility  Care plan was discussed with  patient and RN.   Thank you for allowing the Palliative Medicine Team to assist in the care of this patient.   Time In: 10 Time Out: 10.25 Total Time 25 Prolonged Time Billed  no       Greater than 50%  of this time was spent counseling and coordinating care related to the above assessment and plan.  Loistine Chance, MD  Please contact Palliative Medicine Team phone at [phone] for questions and concerns.

## 2021-01-22 NOTE — Progress Notes (Addendum)
[facility] [address] [facility]) Hospital Liaison RN Note  Unfortunately [place] is not able to offer a room today.   Family and TOC aware hospital liaison will follow up tomorrow or sooner if a room becomes available.   Please do not hesitate to call with any hospice related questions.   Thank you,   Bobbie "Loren Racer, [place], BSN [facility] Liaison [phone]

## 2021-01-22 NOTE — Progress Notes (Signed)
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  Patient herself was complaining of pain and shortness of breath.  In the emergency room blood pressure is stable.  Oxygen 70% on room air, started on high flow and switched to BiPAP and admitted to stepdown unit. Patient currently remains on Dilaudid infusion.  Waiting for inpatient hospice. 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 Severe constipation related to opioids.  Plan: Currently with comfort care in the hospital, oxygen.  BiPAP as needed for comfort. Currently remains on continuous Dilaudid infusion. No response to 1 dose of methylnaltrexone for constipation. Will continue to provide end-of-life care in the hospital. Transfer to inpatient hospice when bed is available. RN can pronounce death if happens in the hospital.   DVT prophylaxis:   Comfort care   Code Status: DNR/DNI. Family Communication: None today. Disposition Plan: Status is: Inpatient  Remains inpatient appropriate because:IV treatments appropriate due to intensity of illness or inability to take PO and Inpatient level of care appropriate due to severity of illness  Dispo: The patient is from: Home              Anticipated d/c is to: Inpatient hospice               Patient currently is not medically stable to d/c.  Only stable for hospice level of care.   Difficult to place patient No     Consultants:  Palliative medicine  Procedures:  None  Antimicrobials:  Augmentin 8/2-8/3   Subjective: Seen and examined in the morning rounds about 9 AM.  She was sleepy, mumbling.  She stated she is okay.   Objective: Vitals:   01/21/21 2136 01/21/21 2152 01/22/21 0846 01/22/21 1146  BP: 103/82     Pulse: (!) 129 94    Resp: 16     Temp: 98.1 F (36.7 C)     TempSrc: Oral     SpO2: 94%  97% 90%  Weight:      Height:        Intake/Output Summary (Last 24 hours) at 01/22/2021 1526 Last data filed at 01/22/2021 1438 Gross per 24 hour  Intake 41.67 ml  Output 1250 ml  Net -1208.33 ml   Filed Weights   01/18/21 1615  Weight: 65.9 kg    Examination:  General: Sick looking, frail and debilitated .  Alert on stimulation but difficult to assess orientation.  She was mostly sleepy. Cardiovascular: S1-S2 normal.  Tachycardic.  Regular rate and rhythm. Respiratory: Bilateral clear.  Poor air entry.   Gastrointestinal: Soft but distended.  Mild diffuse tenderness present.  Left lower quadrant colostomy present but no stool.  Bowel sounds present.    Data Reviewed: I have personally reviewed following labs and imaging studies  CBC: Recent Labs  Lab 01/16/21 0456 01/18/21 1004  WBC  5.7 8.4  NEUTROABS 4.0  --   HGB 11.2* 12.0  HCT 37.2 39.6  MCV 91.2 90.8  PLT 284 123456   Basic Metabolic Panel: Recent Labs  Lab 01/16/21 0456 01/18/21 1004  NA 137 137  K 3.5 3.3*  CL 98 96*  CO2 29 30  GLUCOSE 90 113*  BUN <5* 6*  CREATININE 0.61 0.41*  CALCIUM 8.5* 8.7*  MG 1.8  --    GFR: Estimated Creatinine Clearance: 68.1 mL/min (A) (by C-G formula based on SCr of 0.41 mg/dL (L)). Liver Function Tests: Recent Labs  Lab 01/16/21 0456  AST 25  ALT 19  ALKPHOS 65  BILITOT 0.8  PROT 5.7*  ALBUMIN 2.1*   No results for input(s):  LIPASE, AMYLASE in the last 168 hours. No results for input(s): AMMONIA in the last 168 hours. Coagulation Profile: No results for input(s): INR, PROTIME in the last 168 hours.  Cardiac Enzymes: No results for input(s): CKTOTAL, CKMB, CKMBINDEX, TROPONINI in the last 168 hours. BNP (last 3 results) No results for input(s): PROBNP in the last 8760 hours. HbA1C: No results for input(s): HGBA1C in the last 72 hours. CBG: Recent Labs  Lab 01/18/21 1631  GLUCAP 112*   Lipid Profile: No results for input(s): CHOL, HDL, LDLCALC, TRIG, CHOLHDL, LDLDIRECT in the last 72 hours. Thyroid Function Tests: No results for input(s): TSH, T4TOTAL, FREET4, T3FREE, THYROIDAB in the last 72 hours. Anemia Panel: No results for input(s): VITAMINB12, FOLATE, FERRITIN, TIBC, IRON, RETICCTPCT in the last 72 hours. Sepsis Labs: Recent Labs  Lab 01/18/21 1031 01/18/21 1231  LATICACIDVEN 2.0* 1.3    Recent Results (from the past 240 hour(s))  Blood culture (routine x 2)     Status: None   Collection Time: 01/12/21 10:07 PM   Specimen: BLOOD  Result Value Ref Range Status   Specimen Description BLOOD LEFT ANTECUBITAL  Final   Special Requests   Final    BOTTLES DRAWN AEROBIC AND ANAEROBIC Blood Culture adequate volume   Culture   Final    NO GROWTH 5 DAYS Performed at [facility], [address] [place], [place] [address]    Report Status 01/18/2021 FINAL  Final  Blood culture (routine x 2)     Status: None   Collection Time: 01/12/21 10:07 PM   Specimen: BLOOD RIGHT ARM  Result Value Ref Range Status   Specimen Description BLOOD RIGHT ARM  Final   Special Requests   Final    BOTTLES DRAWN AEROBIC ONLY Blood Culture adequate volume   Culture   Final    NO GROWTH 5 DAYS Performed at [facility], [place] [address] [place], [place] [address]    Report Status 01/18/2021 FINAL  Final  Resp Panel by RT-PCR (Flu A&B, Covid) Nasopharyngeal Swab     Status: None   Collection Time:  01/13/21 12:38 AM   Specimen: Nasopharyngeal Swab; Nasopharyngeal(NP) swabs in vial transport medium  Result Value Ref Range Status   SARS Coronavirus 2 by RT PCR NEGATIVE NEGATIVE Final    Comment: (NOTE) SARS-CoV-2 target nucleic acids are NOT DETECTED.  The SARS-CoV-2 RNA is generally detectable in upper respiratory specimens during the acute phase of infection. The lowest concentration of SARS-CoV-2 viral copies this assay can detect is 138 copies/mL. A negative result does not preclude SARS-Cov-2 infection and should not be used as the sole basis for treatment or other patient management decisions. A negative result may occur with  improper specimen collection/handling,  submission of specimen other than nasopharyngeal swab, presence of viral mutation(s) within the areas targeted by this assay, and inadequate number of viral copies(<138 copies/mL). A negative result must be combined with clinical observations, patient history, and epidemiological information. The expected result is Negative.  Fact Sheet for Patients:  EntrepreneurPulse.com.au  Fact Sheet for Healthcare Providers:  IncredibleEmployment.be  This test is no t yet approved or cleared by the [place] FDA and  has been authorized for detection and/or diagnosis of SARS-CoV-2 by FDA under an Emergency Use Authorization (EUA). This EUA will remain  in effect (meaning this test can be used) for the duration of the COVID-19 declaration under Section 564(b)(1) of the Act, 21 U.S.C.section 360bbb-3(b)(1), unless the authorization is terminated  or revoked sooner.       Influenza A by PCR NEGATIVE NEGATIVE Final   Influenza B by PCR NEGATIVE NEGATIVE Final    Comment: (NOTE) The Xpert Xpress SARS-CoV-2/FLU/RSV plus assay is intended as an aid in the diagnosis of influenza from Nasopharyngeal swab specimens and should not be used as a sole basis for treatment. Nasal washings  and aspirates are unacceptable for Xpert Xpress SARS-CoV-2/FLU/RSV testing.  Fact Sheet for Patients: EntrepreneurPulse.com.au  Fact Sheet for Healthcare Providers: IncredibleEmployment.be  This test is not yet approved or cleared by the [place] FDA and has been authorized for detection and/or diagnosis of SARS-CoV-2 by FDA under an Emergency Use Authorization (EUA). This EUA will remain in effect (meaning this test can be used) for the duration of the COVID-19 declaration under Section 564(b)(1) of the Act, 21 U.S.C. section 360bbb-3(b)(1), unless the authorization is terminated or revoked.  Performed at [facility], [place] [address] [place], [place] [address]   MRSA Next Gen by PCR, Nasal     Status: None   Collection Time: 01/14/21 10:22 PM   Specimen: Nasal Mucosa; Nasal Swab  Result Value Ref Range Status   MRSA by PCR Next Gen NOT DETECTED NOT DETECTED Final    Comment: (NOTE) The GeneXpert MRSA Assay (FDA approved for NASAL specimens only), is one component of a comprehensive MRSA colonization surveillance program. It is not intended to diagnose MRSA infection nor to guide or monitor treatment for MRSA infections. Test performance is not FDA approved in patients less than 43 years old. Performed at [facility], [place] [address] [place], [place] [address]   SARS CORONAVIRUS 2 (TAT 6-24 HRS) Nasopharyngeal Nasopharyngeal Swab     Status: None   Collection Time: 01/18/21 12:50 PM   Specimen: Nasopharyngeal Swab  Result Value Ref Range Status   SARS Coronavirus 2 NEGATIVE NEGATIVE Final    Comment: (NOTE) SARS-CoV-2 target nucleic acids are NOT DETECTED.  The SARS-CoV-2 RNA is generally detectable in upper and lower respiratory specimens during the acute phase of infection. Negative results do not preclude SARS-CoV-2 infection, do not rule out co-infections with other pathogens, and should not be used as the sole  basis for treatment or other patient management decisions. Negative results must be combined with clinical observations, patient history, and epidemiological information. The expected result is Negative.  Fact Sheet for Patients: SugarRoll.be  Fact Sheet for Healthcare Providers: https://www.woods-mathews.com/  This test is not yet approved or cleared by the [place] FDA and  has been authorized for detection and/or diagnosis of SARS-CoV-2 by FDA under an Emergency Use Authorization (EUA). This EUA will remain  in effect (meaning this test can be used) for the duration of the COVID-19 declaration  under Se ction 564(b)(1) of the Act, 21 U.S.C. section 360bbb-3(b)(1), unless the authorization is terminated or revoked sooner.  Performed at [facility], [place] [address], [place], [place] [address]   MRSA Next Gen by PCR, Nasal     Status: None   Collection Time: 01/18/21  4:25 PM   Specimen: Nasal Mucosa; Nasal Swab  Result Value Ref Range Status   MRSA by PCR Next Gen NOT DETECTED NOT DETECTED Final    Comment: (NOTE) The GeneXpert MRSA Assay (FDA approved for NASAL specimens only), is one component of a comprehensive MRSA colonization surveillance program. It is not intended to diagnose MRSA infection nor to guide or monitor treatment for MRSA infections. Test performance is not FDA approved in patients less than 62 years old. Performed at [facility], [place] [address], [place], [place] [address]          Radiology Studies: No results found.      Scheduled Meds:  chlorhexidine  15 mL Mouth Rinse BID   escitalopram  10 mg Oral Daily   fentaNYL  1 patch Transdermal Q72H   fluticasone furoate-vilanterol  1 puff Inhalation Daily   furosemide  40 mg Intravenous BID   ipratropium-albuterol  3 mL Nebulization TID   mouth rinse  15 mL Mouth Rinse q12n4p   polyvinyl alcohol  1 drop Both Eyes TID    senna-docusate  1 tablet Oral BID   Continuous Infusions:  HYDROmorphone 1.25 mg/hr (01/22/21 1325)     LOS: 4 days    Time spent: 25 minutes   Barb Merino, MD [facility] Pager [phone]

## 2021-01-22 NOTE — TOC Progression Note (Signed)
Transition of Care [facility]) - Progression Note    Patient Details  Name: Chelsea Mcintosh MRN: [id number] Date of Birth: 1961-04-11  Transition of Care [facility]) CM/SW Contact  Ross Ludwig, [place] Phone Number: 01/22/2021, 12:50 PM  Clinical Narrative:    [facility] still does not have a bed available.  TOC to continue to follow.   Expected Discharge Plan: Home w Hospice Care Barriers to Discharge: Continued Medical Work up  Expected Discharge Plan and Services Expected Discharge Plan: [facility]   Discharge Planning Services: CM Consult   Living arrangements for the past 2 months: Single Family Home Expected Discharge Date:  (unknown)                                     Social Determinants of Health (SDOH) Interventions    Readmission Risk Interventions No flowsheet data found.

## 2021-01-23 NOTE — Progress Notes (Signed)
[facility] ([facility]) Hospital Liaison note.    Chart and pt information have been reviewed by [facility] physician.  Hospice eligibility confirmed.  [facility] is unable to offer a room today. Hospital Liaison will follow up tomorrow or sooner if a room becomes available. Please do not hesitate to call with questions.    Thank you for the opportunity to participate in this patient's care.  Chrislyn King, BSN, RN [facility] Hospital Liaison (listed on [facility] under Hospice/[facility])    [phone] (24h on call)  

## 2021-01-23 NOTE — Progress Notes (Signed)
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  Patient herself was complaining of pain and shortness of breath.  In the emergency room blood pressure is stable.  Oxygen 70% on room air, started on high flow and switched to BiPAP and admitted to stepdown unit. Patient currently remains on Dilaudid infusion.  Waiting for inpatient hospice. 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 Severe constipation related to opioids.  Plan: Currently with comfort care in the hospital, oxygen.   Patient is on continuous Dilaudid infusion with 1.25 mg/h, still with insufficient symptom control.  Increase to 1.5 mg/h.  Supplemental medication management with Ativan, Lasix, fentanyl patch. Will continue to provide end-of-life care in the hospital. Transfer to inpatient hospice when bed is available. RN can pronounce death if happens in the hospital.   DVT prophylaxis:   Comfort care   Code Status: DNR/DNI. Family Communication: None. Disposition Plan: Status is: Inpatient  Remains inpatient appropriate because:IV treatments appropriate due to intensity of illness or inability to take PO and Inpatient level of care appropriate due to severity of illness  Dispo: The patient is from: Home               Anticipated d/c is to: Inpatient hospice              Patient currently is not medically stable to d/c.  Only stable for hospice level of care.   Difficult to place patient No     Consultants:  Palliative medicine  Procedures:  None  Antimicrobials:  Augmentin 8/2-8/3   Subjective: Patient seen and examined.  Sleepy tired and lethargic.  Unable to keep up conversation in full sentences.  She became very restless and nurses have to give her another bolus of Dilaudid.  And dose increased.  Comfortable after that.  Objective: Vitals:   01/22/21 1740 01/22/21 2043 01/23/21 0448 01/23/21 0839  BP: 139/77  133/78   Pulse: (!) 111  (!) 124   Resp: 15  14   Temp: 97.6 F (36.4 C)  97.7 F (36.5 C)   TempSrc: Oral  Oral   SpO2: 99% 96% 98% 97%  Weight:      Height:        Intake/Output Summary (Last 24 hours) at 01/23/2021 1341 Last data filed at 01/23/2021 0440 Gross per 24 hour  Intake 0 ml  Output 750 ml  Net -750 ml   Filed Weights   01/18/21 1615  Weight: 65.9 kg    Examination:  General: Sick looking, frail and debilitated .  Mostly sleepy and lethargic.  Unable to keep up conversation.   Cardiovascular: S1-S2 normal.  Tachycardic.  Regular rate and rhythm. Respiratory: Bilateral clear.  Poor air entry.   Gastrointestinal: Soft but distended.  Mild diffuse tenderness present.  Left lower quadrant colostomy present but no stool.  Bowel sounds present.    Data Reviewed: I have personally reviewed following labs and imaging studies  CBC: Recent Labs  Lab 01/18/21 1004  WBC 8.4  HGB 12.0  HCT 39.6  MCV 90.8  PLT 123456   Basic Metabolic Panel: Recent Labs  Lab 01/18/21 1004  NA 137  K 3.3*  CL 96*  CO2 30  GLUCOSE 113*  BUN 6*  CREATININE 0.41*  CALCIUM 8.7*   GFR: Estimated Creatinine Clearance: 68.1 mL/min (A) (by C-G formula based on SCr of 0.41 mg/dL (L)). Liver Function Tests: No results for input(s): AST, ALT, ALKPHOS, BILITOT, PROT, ALBUMIN  in the last 168 hours.  No results for input(s): LIPASE, AMYLASE in the last 168 hours. No results for input(s): AMMONIA in the last 168 hours. Coagulation Profile: No results for input(s): INR, PROTIME in the last 168 hours.  Cardiac Enzymes: No results for input(s): CKTOTAL, CKMB, CKMBINDEX, TROPONINI in the last 168 hours. BNP (last 3 results) No results for input(s): PROBNP in the last 8760 hours. HbA1C: No results for input(s): HGBA1C in the last 72 hours. CBG: Recent Labs  Lab 01/18/21 1631  GLUCAP 112*   Lipid Profile: No results for input(s): CHOL, HDL, LDLCALC, TRIG, CHOLHDL, LDLDIRECT in the last 72 hours. Thyroid Function Tests: No results for input(s): TSH, T4TOTAL, FREET4, T3FREE, THYROIDAB in the last 72 hours. Anemia Panel: No results for input(s): VITAMINB12, FOLATE, FERRITIN, TIBC, IRON, RETICCTPCT in the last 72 hours. Sepsis Labs: Recent Labs  Lab 01/18/21 1031 01/18/21 1231  LATICACIDVEN 2.0* 1.3    Recent Results (from the past 240 hour(s))  MRSA Next Gen by PCR, Nasal     Status: None   Collection Time: 01/14/21 10:22 PM   Specimen: Nasal Mucosa; Nasal Swab  Result Value Ref Range Status   MRSA by PCR Next Gen NOT DETECTED NOT DETECTED Final    Comment: (NOTE) The GeneXpert MRSA Assay (FDA approved for NASAL specimens only), is one component of a comprehensive MRSA colonization surveillance program. It is not intended to diagnose MRSA infection nor to guide or monitor treatment for MRSA infections. Test performance is not FDA approved in patients less than 82 years old. Performed at [facility], [place] [address] [place], [place] [address]   SARS CORONAVIRUS 2 (TAT 6-24 HRS) Nasopharyngeal Nasopharyngeal Swab     Status: None   Collection Time: 01/18/21 12:50 PM   Specimen: Nasopharyngeal Swab  Result Value Ref Range Status   SARS Coronavirus 2 NEGATIVE NEGATIVE Final    Comment: (NOTE) SARS-CoV-2 target nucleic acids are NOT  DETECTED.  The SARS-CoV-2 RNA is generally detectable in upper and lower respiratory specimens during the acute phase of infection. Negative results do not preclude SARS-CoV-2 infection, do not rule out co-infections with other pathogens, and should not be used as the sole basis for treatment or other patient management decisions. Negative results must be combined with clinical observations, patient history, and epidemiological information. The expected result is Negative.  Fact Sheet for Patients: SugarRoll.be  Fact Sheet for Healthcare Providers: https://www.woods-mathews.com/  This test is not yet approved or cleared by the [place] FDA and  has been authorized for detection and/or diagnosis of SARS-CoV-2 by FDA under an Emergency Use Authorization (EUA). This EUA will remain  in effect (meaning this test can be used) for the duration of the COVID-19 declaration under Se ction 564(b)(1) of the Act, 21 U.S.C. section 360bbb-3(b)(1), unless the authorization is terminated or revoked sooner.  Performed  at [facility], [place] [address], [place], [place] [address]   MRSA Next Gen by PCR, Nasal     Status: None   Collection Time: 01/18/21  4:25 PM   Specimen: Nasal Mucosa; Nasal Swab  Result Value Ref Range Status   MRSA by PCR Next Gen NOT DETECTED NOT DETECTED Final    Comment: (NOTE) The GeneXpert MRSA Assay (FDA approved for NASAL specimens only), is one component of a comprehensive MRSA colonization surveillance program. It is not intended to diagnose MRSA infection nor to guide or monitor treatment for MRSA infections. Test performance is not FDA approved in patients less than 7 years old. Performed at [facility], [place] [address] [place], [place] [address]          Radiology Studies: No results found.      Scheduled Meds:  chlorhexidine  15 mL Mouth Rinse BID   escitalopram  10 mg Oral  Daily   fentaNYL  1 patch Transdermal Q72H   fluticasone furoate-vilanterol  1 puff Inhalation Daily   furosemide  40 mg Intravenous BID   ipratropium-albuterol  3 mL Nebulization TID   mouth rinse  15 mL Mouth Rinse q12n4p   polyvinyl alcohol  1 drop Both Eyes TID   senna-docusate  1 tablet Oral BID   Continuous Infusions:  HYDROmorphone 1.25 mg/hr (01/23/21 0927)     LOS: 5 days    Time spent: 25 minutes   Barb Merino, MD [facility] Pager [phone]

## 2021-01-23 NOTE — Progress Notes (Signed)
Daily Progress Note   Patient Name: Chelsea Mcintosh       Date: 01/23/2021 DOB: 05-03-1961  Age: 60 y.o. MRN#: [id number] Attending Physician: Barb Merino, MD Primary Care Physician: System, Provider Not In Admit Date: 01/18/2021  Reason for Consultation/Follow-up: Terminal Care  Subjective: Patient noted to be resting comfortably at the time of my visit.  Patient's mother present at bedside.  Patient remains on Dilaudid drip.  Appears comfortable.  Awaiting transfer to hospice.  Length of Stay: 5  Current Medications: Scheduled Meds:   chlorhexidine  15 mL Mouth Rinse BID   escitalopram  10 mg Oral Daily   fentaNYL  1 patch Transdermal Q72H   fluticasone furoate-vilanterol  1 puff Inhalation Daily   furosemide  40 mg Intravenous BID   ipratropium-albuterol  3 mL Nebulization TID   mouth rinse  15 mL Mouth Rinse q12n4p   polyvinyl alcohol  1 drop Both Eyes TID   senna-docusate  1 tablet Oral BID    Continuous Infusions:  HYDROmorphone 1.25 mg/hr (01/23/21 0927)    PRN Meds: acetaminophen **OR** acetaminophen, antiseptic oral rinse, glycopyrrolate **OR** glycopyrrolate **OR** glycopyrrolate, guaiFENesin, haloperidol **OR** haloperidol **OR** haloperidol lactate, HYDROmorphone, ipratropium-albuterol, lip balm, LORazepam, ondansetron (ZOFRAN) IV  Physical Exam         Currently asleep, is able to be awakened. Appears with generalized weakness  ill appearing Has colostomy Shallow breath sounds  Vital Signs: BP 133/78 (BP Location: Left Arm)   Pulse (!) 124   Temp 97.7 F (36.5 C) (Oral)   Resp 14   Ht '5\' 4"'$  (1.626 m)   Wt 65.9 kg   SpO2 97%   BMI 24.94 kg/m  SpO2: SpO2: 97 % O2 Device: O2 Device: Nasal Cannula O2 Flow Rate: O2 Flow Rate (L/min): 4  L/min  Intake/output summary:  Intake/Output Summary (Last 24 hours) at 01/23/2021 1340 Last data filed at 01/23/2021 0440 Gross per 24 hour  Intake 0 ml  Output 750 ml  Net -750 ml    LBM: Last BM Date:  (PTA, no stool in pouch) Baseline Weight: Weight: 65.9 kg Most recent weight: Weight: 65.9 kg      PPS 20% Palliative Assessment/Data:      Patient Active Problem List   Diagnosis Date Noted   End of life care 01/20/2021  Cancer associated pain 01/19/2021   Acute hypoxemic respiratory failure ([place]) 01/19/2021   Hypoxia 01/18/2021   Metastatic malignant neoplasm (HCC)    HCAP (healthcare-associated pneumonia) 01/13/2021    Palliative Care Assessment & Plan   Patient Profile:    Assessment:  60 y.o. 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  now on comfort measures.   Recommendations/Plan:  DNR DNI Comfort care Continue Dilaudid drip, titrate to comfort.  Briefly reviewed again about current scope of treatment, comfort measures and beacon place with patient's mother present at bedside. Awaiting [facility] bed.   Goals of Care and Additional Recommendations: Limitations on Scope of Treatment: Full Comfort Care  Code Status:    Code Status Orders  (From admission, onward)           Start     Ordered   01/21/21 1350  Do not attempt resuscitation (DNR)  Continuous       Question Answer Comment  In the event of cardiac or respiratory ARREST Do not call a "code blue"   In the event of cardiac or respiratory ARREST Do not perform Intubation, CPR, defibrillation or ACLS   In the event of cardiac or respiratory ARREST Use medication by any route, position, wound care, and other measures to  relive pain and suffering. May use oxygen, suction and manual treatment of airway obstruction as needed for comfort.      01/21/21 1350           Code Status History     Date Active Date Inactive Code Status Order ID Comments User Context   01/20/2021 1431 01/21/2021 1350 DNR [id number]  Pershing Proud, NP Inpatient   01/18/2021 1420 01/20/2021 1430 Full Code [id number]  Lequita Halt, MD ED   01/13/2021 0214 01/17/2021 2025 Full Code [id number]  Kayleen Memos, DO ED       Prognosis:  < 2 weeks  Discharge Planning: Hospice facility  Care plan was discussed with  patient and patient's mother.   Thank you for allowing the Palliative Medicine Team to assist in the care of this patient.   Time In: 12 Time Out: 12.25 Total Time 25 Prolonged Time Billed  no       Greater than 50%  of this time was spent counseling and coordinating care related to the above assessment and plan.  Loistine Chance, MD  Please contact Palliative Medicine Team phone at [phone] for questions and concerns.

## 2021-01-24 NOTE — Progress Notes (Signed)
Manufacturing engineer [facility]) Hospital Liaison note.     Chart and pt information have been reviewed by [facility] physician. [facility] eligibility confirmed.   [facility] is unable to offer a room today. Hospital Liaison will follow up tomorrow or sooner if a room becomes available. Please do not hesitate to call with questions.     Thank you for the opportunity to participate in this patient's care.   Clementeen Hoof, BSN, RN [facility] Liaison (listed on [facility] under Hospice/[facility])    [phone] (24h on call)

## 2021-01-24 NOTE — Progress Notes (Signed)
Daily Progress Note   Patient Name: Chelsea Mcintosh       Date: 01/24/2021 DOB: [birth year]  Age: 60 y.o. MRN#: [id number] Attending Physician: Barb Merino, MD Primary Care Physician: System, Provider Not In Admit Date: 01/18/2021  Reason for Consultation/Follow-up: Terminal Care  Subjective: Patient is awake and resting in bed, seen alongside Dr Sloan Leiter. Patient verbalizes that she wants to be kept comfortable, she asks for [place] to help her. We discussed about her current condition and her goals of care.remains comfortable on Dilaudid drip, has provision for boluses as well.    Awaiting transfer to hospice.  Length of Stay: 6  Current Medications: Scheduled Meds:   chlorhexidine  15 mL Mouth Rinse BID   escitalopram  10 mg Oral Daily   fentaNYL  1 patch Transdermal Q72H   fluticasone furoate-vilanterol  1 puff Inhalation Daily   furosemide  40 mg Intravenous BID   ipratropium-albuterol  3 mL Nebulization TID   mouth rinse  15 mL Mouth Rinse q12n4p   polyvinyl alcohol  1 drop Both Eyes TID   senna-docusate  1 tablet Oral BID    Continuous Infusions:  HYDROmorphone 1.25 mg/hr (01/24/21 0602)    PRN Meds: acetaminophen **OR** acetaminophen, antiseptic oral rinse, glycopyrrolate **OR** glycopyrrolate **OR** glycopyrrolate, guaiFENesin, haloperidol **OR** haloperidol **OR** haloperidol lactate, HYDROmorphone, ipratropium-albuterol, lip balm, LORazepam, ondansetron (ZOFRAN) IV  Physical Exam         Currently awake Appears with generalized weakness  ill appearing Has colostomy Shallow breath sounds  Vital Signs: BP 131/75 (BP Location: Right Arm)   Pulse (!) 117   Temp 97.6 F (36.4 C) (Oral)   Resp 20   Ht '5\' 4"'$  (1.626 m)   Wt 65.9 kg   SpO2 95%   BMI 24.94 kg/m  SpO2:  SpO2: 95 % O2 Device: O2 Device: Nasal Cannula O2 Flow Rate: O2 Flow Rate (L/min): 4 L/min  Intake/output summary:  Intake/Output Summary (Last 24 hours) at 01/24/2021 1053 Last data filed at 01/24/2021 0900 Gross per 24 hour  Intake 186.92 ml  Output 0 ml  Net 186.92 ml    LBM: Last BM Date:  (PTA, no stool in pouch) Baseline Weight: Weight: 65.9 kg Most recent weight: Weight: 65.9 kg      PPS 20% Palliative Assessment/Data:  Patient Active Problem List   Diagnosis Date Noted   End of life care 01/20/2021   Cancer associated pain 01/19/2021   Acute hypoxemic respiratory failure ([place]) 01/19/2021   Hypoxia 01/18/2021   Metastatic malignant neoplasm ([place])    HCAP (healthcare-associated pneumonia) 01/13/2021    Palliative Care Assessment & Plan   Patient Profile:    Assessment:  60 y.o. 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  now on comfort measures.   Recommendations/Plan:  DNR DNI Comfort care Continue Dilaudid drip, titrate to comfort.  Briefly reviewed again about current scope of treatment, comfort measures and beacon place with patient  at bedside. Awaiting beacon place bed.   Goals of Care and Additional Recommendations: Limitations on Scope of Treatment: Full Comfort Care  Code Status:    Code Status Orders  (From admission, onward)           Start     Ordered   01/21/21 1350  Do not attempt resuscitation (DNR)  Continuous       Question Answer Comment  In the event of cardiac or respiratory ARREST Do not call a "code blue"   In the event of cardiac or respiratory ARREST Do not perform Intubation, CPR, defibrillation or ACLS   In the event of cardiac or respiratory  ARREST Use medication by any route, position, wound care, and other measures to relive pain and suffering. May use oxygen, suction and manual treatment of airway obstruction as needed for comfort.      01/21/21 1350           Code Status History     Date Active Date Inactive Code Status Order ID Comments User Context   01/20/2021 1431 01/21/2021 1350 DNR [id number]  Pershing Proud, NP Inpatient   01/18/2021 1420 01/20/2021 1430 Full Code [id number]  Lequita Halt, MD ED   01/13/2021 0214 01/17/2021 2025 Full Code [id number]  Kayleen Memos, DO ED       Prognosis:  < 2 weeks  Discharge Planning: Hospice facility  Care plan was discussed with  patient and [facility] MD.   Thank you for allowing the Palliative Medicine Team to assist in the care of this patient.   Time In: 9 Time Out: 9.25 Total Time 25 Prolonged Time Billed  no       Greater than 50%  of this time was spent counseling and coordinating care related to the above assessment and plan.  Loistine Chance, MD  Please contact Palliative Medicine Team phone at [phone] for questions and concerns.

## 2021-01-24 NOTE — Progress Notes (Signed)
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  Patient herself was complaining of pain and shortness of breath.  In the emergency room blood pressure is stable.  Oxygen 70% on room air, started on high flow and switched to BiPAP and admitted to stepdown unit. Patient currently remains on Dilaudid infusion.  Waiting for inpatient hospice. 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 Severe constipation related to opioids.  Plan: Currently with comfort care in the hospital, oxygen.  On increasing dose of Dilaudid infusion.  Also bolus Dilaudid available. Multiple other symptom control medications.  We will continue to provide end-of-life care in the hospital. Will continue to provide end-of-life care in the hospital. Transfer to inpatient hospice when bed is available. RN can pronounce death if happens in the hospital.   DVT prophylaxis:   Comfort care   Code Status: DNR/DNI. Family Communication: None today. Disposition Plan: Status is: Inpatient  Remains inpatient appropriate because:IV treatments appropriate due to intensity of illness or inability to take PO and Inpatient level of care appropriate due to severity of illness  Dispo: The patient is from: Home              Anticipated  d/c is to: Inpatient hospice              Patient currently is not medically stable to d/c.  Only stable for hospice level of care.   Difficult to place patient No     Consultants:  Palliative medicine  Procedures:  None  Antimicrobials:  Augmentin 8/2-8/3   Subjective: Patient seen and examined.  She is drowsy and lethargic.  She is asking to help her.  Not sure she is understanding what is going on with her.  She denies being in pain.  No family at bedside.  Objective: Vitals:   01/23/21 1402 01/23/21 2042 01/24/21 0433 01/24/21 0810  BP:   131/75   Pulse:   (!) 117   Resp:   20   Temp:   97.6 F (36.4 C)   TempSrc:   Oral   SpO2: 90% 94% 99% 95%  Weight:      Height:        Intake/Output Summary (Last 24 hours) at 01/24/2021 1330 Last data filed at 01/24/2021 0900 Gross per 24 hour  Intake 186.92 ml  Output 0 ml  Net 186.92 ml   Filed Weights   01/18/21 1615  Weight: 65.9 kg    Examination:  General: Sick looking, frail and debilitated .  Mostly sleepy and lethargic.  Unable to keep up conversation.   Cardiovascular: S1-S2 normal.  Tachycardic.  Regular rate and rhythm. Respiratory: Bilateral clear.  Poor air entry.   Gastrointestinal: Soft but distended.  Mild diffuse tenderness present.  Left lower quadrant colostomy present but no stool.  Bowel sounds present.  Data Reviewed: I have personally reviewed following labs and imaging studies  CBC: Recent Labs  Lab 01/18/21 1004  WBC 8.4  HGB 12.0  HCT 39.6  MCV 90.8  PLT 123456   Basic Metabolic Panel: Recent Labs  Lab 01/18/21 1004  NA 137  K 3.3*  CL 96*  CO2 30  GLUCOSE 113*  BUN 6*  CREATININE 0.41*  CALCIUM 8.7*   GFR: Estimated Creatinine Clearance: 68.1 mL/min (A) (by C-G formula based on SCr of 0.41 mg/dL (L)). Liver Function Tests: No results for input(s): AST, ALT, ALKPHOS, BILITOT, PROT, ALBUMIN in the last 168 hours.  No results for input(s): LIPASE, AMYLASE in the last 168  hours. No results for input(s): AMMONIA in the last 168 hours. Coagulation Profile: No results for input(s): INR, PROTIME in the last 168 hours.  Cardiac Enzymes: No results for input(s): CKTOTAL, CKMB, CKMBINDEX, TROPONINI in the last 168 hours. BNP (last 3 results) No results for input(s): PROBNP in the last 8760 hours. HbA1C: No results for input(s): HGBA1C in the last 72 hours. CBG: Recent Labs  Lab 01/18/21 1631  GLUCAP 112*   Lipid Profile: No results for input(s): CHOL, HDL, LDLCALC, TRIG, CHOLHDL, LDLDIRECT in the last 72 hours. Thyroid Function Tests: No results for input(s): TSH, T4TOTAL, FREET4, T3FREE, THYROIDAB in the last 72 hours. Anemia Panel: No results for input(s): VITAMINB12, FOLATE, FERRITIN, TIBC, IRON, RETICCTPCT in the last 72 hours. Sepsis Labs: Recent Labs  Lab 01/18/21 1031 01/18/21 1231  LATICACIDVEN 2.0* 1.3    Recent Results (from the past 240 hour(s))  MRSA Next Gen by PCR, Nasal     Status: None   Collection Time: 01/14/21 10:22 PM   Specimen: Nasal Mucosa; Nasal Swab  Result Value Ref Range Status   MRSA by PCR Next Gen NOT DETECTED NOT DETECTED Final    Comment: (NOTE) The GeneXpert MRSA Assay (FDA approved for NASAL specimens only), is one component of a comprehensive MRSA colonization surveillance program. It is not intended to diagnose MRSA infection nor to guide or monitor treatment for MRSA infections. Test performance is not FDA approved in patients less than 68 years old. Performed at [facility], [place] [address], [place], [place] [address]   SARS CORONAVIRUS 2 (TAT 6-24 HRS) Nasopharyngeal Nasopharyngeal Swab     Status: None   Collection Time: 01/18/21 12:50 PM   Specimen: Nasopharyngeal Swab  Result Value Ref Range Status   SARS Coronavirus 2 NEGATIVE NEGATIVE Final    Comment: (NOTE) SARS-CoV-2 target nucleic acids are NOT DETECTED.  The SARS-CoV-2 RNA is generally detectable in upper and lower respiratory  specimens during the acute phase of infection. Negative results do not preclude SARS-CoV-2 infection, do not rule out co-infections with other pathogens, and should not be used as the sole basis for treatment or other patient management decisions. Negative results must be combined with clinical observations, patient history, and epidemiological information. The expected result is Negative.  Fact Sheet for Patients: SugarRoll.be  Fact Sheet for Healthcare Providers: https://www.woods-mathews.com/  This test is not yet approved or cleared by the [place] FDA and  has been authorized for detection and/or diagnosis of SARS-CoV-2 by FDA under an Emergency Use Authorization (EUA). This EUA will remain  in effect (meaning this test can be used) for the duration of the COVID-19 declaration under Se ction 564(b)(1) of the Act, 21 U.S.C. section 360bbb-3(b)(1), unless the authorization is terminated or revoked sooner.  Performed at [facility], [address] [place], [place] [address]   MRSA Next Gen by PCR, Nasal     Status: None   Collection Time: 01/18/21  4:25 PM   Specimen: Nasal Mucosa; Nasal Swab  Result Value Ref Range Status   MRSA by PCR Next Gen NOT DETECTED NOT DETECTED Final    Comment: (NOTE) The GeneXpert MRSA Assay (FDA approved for NASAL specimens only), is one component of a comprehensive MRSA colonization surveillance program. It is not intended to diagnose MRSA infection nor to guide or monitor treatment for MRSA infections. Test performance is not FDA approved in patients less than 24 years old. Performed at [facility], [place] [address], [place], [place] [address]          Radiology Studies: No results found.      Scheduled Meds:  chlorhexidine  15 mL Mouth Rinse BID   escitalopram  10 mg Oral Daily   fentaNYL  1 patch Transdermal Q72H   fluticasone furoate-vilanterol  1 puff  Inhalation Daily   furosemide  40 mg Intravenous BID   ipratropium-albuterol  3 mL Nebulization TID   mouth rinse  15 mL Mouth Rinse q12n4p   polyvinyl alcohol  1 drop Both Eyes TID   senna-docusate  1 tablet Oral BID   Continuous Infusions:  HYDROmorphone 1.25 mg/hr (01/24/21 0602)     LOS: 6 days    Time spent: 25 minutes   Barb Merino, MD [facility] Pager [phone]

## 2021-01-24 NOTE — Care Management Important Message (Signed)
Important Message  Patient Details IM Letter given to the Patient. Name: Chelsea Mcintosh MRN: [id number] Date of Birth: Apr 07, 1961   Medicare Important Message Given:  Yes     Kerin Salen 01/24/2021, 12:14 PM

## 2021-01-25 NOTE — Progress Notes (Signed)
[employer] and gave report to [place].  Request to leave port needle in place for transfer.

## 2021-01-25 NOTE — Progress Notes (Signed)
Dilaudid IV gtt to start new bag, Wasted 5cc in prior bag. Witnessed with Adela Ports, RN

## 2021-01-25 NOTE — Progress Notes (Addendum)
Manufacturing engineer [facility]) Hospital Liaison Note:  Patient family ([name]) has accepted a bed offer to transfer to [facility] this evening after 9pm.  Supported and answered questions with family who has [place] contact information and encouraged to call as needed for concerns. TOC aware.  Will update this note once patient family has completed registration paperwork with the [facility] SW this afternoon to let TOC know to arrange transport to [facility]..  Bedside RN will need to call report to [phone].  Please send OOF DNR forms with patient upon discharge to facility.  Please call with any hospice related questions,  [name], RN [place] [phone]  UPDATE AT 1736 - Notified charge nurse to cancel PTAR for transfer to [facility] as [facility] has left for the day.  Per [facility], Family has been meeting with the [facility] SW to do paperwork since 3:30 pm and has many financial questions /concerns and now requesting to delay transfer tonight to speak with our Finance Department tomorrow morning. An [place] will check in with family and TOC in the morning about resolving these issues.

## 2021-01-25 NOTE — TOC Transition Note (Signed)
Transition of Care [facility]) - CM/SW Discharge Note   Patient Details  Name: Chelsea Mcintosh MRN: [id number] Date of Birth: 1961/06/13  [facility] of [facility]) CM/SW Contact:  Lynnell Catalan, RN Phone Number: 01/25/2021, 12:50 PM   Clinical Narrative:     Pt to dc to [facility] today. Originally [facility] could not accept them until 9pm. Was just contacted by [facility] that states they can take her at 6pm now. PTAR called to update on time of pick up. Yellow DNR on chart for transport.

## 2021-01-25 NOTE — Discharge Summary (Signed)
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  Patient herself was complaining of pain and shortness of breath.  In the emergency room blood pressure is stable.  Oxygen 70% on room air, started on high flow and switched to BiPAP and admitted to stepdown unit.  Patient was found to have terminal illness with end-of-life.  She was admitted to hospital seen by different provider and started on end-of-life care. Currently on symptom management with Dilaudid infusion 1.5 mg/h, fentanyl patch 50 mcg, Ativan 1 mg every 2 hours. To continue to provide end-of-life, she will be transferred to inpatient hospice today. Patient will be discharged with Port-A-Cath in place as she will need multiple IV medications to control her symptoms and keep her comfortable.  Critical but able to be transferred.  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 Severe constipation related to opioids.    Discharge Diagnoses:  Principal Problem:   Acute hypoxemic respiratory failure (HCC) Active Problems:   Hypoxia   Metastatic malignant neoplasm (HCC)   Cancer  associated pain   End of life care    Discharge Instructions  Discharge Instructions     Diet general   Complete by: As directed       Allergies as of 01/25/2021       Reactions   Vancomycin Itching   Flagyl [metronidazole]    Pt states it made her "feel weird and nauseous"   Morphine Other (See Comments)   Mental status changes, hallucinations    Sulfasalazine    Severe burning sensation in the esophagus.   Tape    Burning sensation of the skin if left on too long        Medication List     STOP taking these medications    amoxicillin-clavulanate 875-125 MG tablet Commonly known as: AUGMENTIN   benzonatate 100 MG capsule Commonly known as: TESSALON   budesonide-formoterol 80-4.5 MCG/ACT inhaler Commonly known as: SYMBICORT   clonazePAM 0.25 MG disintegrating tablet Commonly known as: KLONOPIN   diphenhydrAMINE 25 MG tablet Commonly known as: BENADRYL   escitalopram 10 MG tablet Commonly known as: LEXAPRO   famotidine 20 MG tablet Commonly known as: PEPCID   guaifenesin 100 MG/5ML syrup Commonly known as: ROBITUSSIN   hydrocerin Crea   ipratropium-albuterol 0.5-2.5 (3) MG/3ML Soln Commonly known as: DUONEB   LORazepam 1 MG tablet Commonly known as: ATIVAN   methocarbamol 500 MG tablet Commonly known as: ROBAXIN   naloxone 4 MG/0.1ML Liqd nasal spray kit Commonly known as: NARCAN   ondansetron 4 MG tablet Commonly known as: ZOFRAN   oxyCODONE 30 MG 12 hr tablet   oxyCODONE 5 MG immediate release tablet Commonly known as: Oxy IR/ROXICODONE   oxyCODONE-acetaminophen 10-325 MG tablet Commonly known as: PERCOCET   polyethylene glycol 17 g packet Commonly known   as: MIRALAX / GLYCOLAX   senna-docusate 8.6-50 MG tablet Commonly known as: Senokot-S   sucralfate 1 g tablet Commonly known as: CARAFATE   SUMAtriptan 50 MG tablet Commonly known as: IMITREX   Systane 0.4-0.3 % Soln Generic drug: Polyethyl Glycol-Propyl Glycol         Allergies  Allergen Reactions   Vancomycin Itching   Flagyl [Metronidazole]     Pt states it made her "feel weird and nauseous"   Morphine Other (See Comments)    Mental status changes, hallucinations    Sulfasalazine     Severe burning sensation in the esophagus.   Tape     Burning sensation of the skin if left on too long    Consultations: Oncology Palliative medicine   Procedures/Studies: DG Chest 2 View  Result Date: 01/12/2021 CLINICAL DATA:  History of stage IV ovarian cancer presenting with shortness of breath. EXAM: CHEST - 2 VIEW COMPARISON:  None. FINDINGS: A right-sided venous Port-A-Cath is seen with its distal tip noted within the right atrium. This is approximately 2.5 cm distal to the junction of the superior vena cava and right atrium. Innumerable ill-defined multifocal opacities are seen scattered throughout both lungs. Mild atelectasis and/or infiltrate is also seen within the lateral aspect of the right lung base. A very small right pleural effusion is seen. No pneumothorax is identified. The heart size and mediastinal contours are within normal limits. The visualized skeletal structures are unremarkable. IMPRESSION: 1. Findings which may represent marked severity bilateral multifocal infiltrates. Given the patient's history of stage IV ovarian cancer, sequelae associated with diffuse pulmonary metastasis cannot be excluded. 2. Mild right basilar atelectasis and/or infiltrate with a very small right pleural effusion. Electronically Signed   By: Thaddeus  Houston M.D.   On: 01/12/2021 22:41   DG Chest Port 1 View  Result Date: 01/18/2021 CLINICAL DATA:  Shortness of breath EXAM: PORTABLE CHEST 1 VIEW COMPARISON:  01/12/2021 FINDINGS: Right Port-A-Cath remains in place, unchanged. Nodular airspace disease again noted throughout both lungs, worsening since prior study. This could reflect pneumonia, metastatic disease, or a combination. No visible effusions or  pneumothorax. IMPRESSION: Worsening bilateral airspace disease, some of which appears nodular. This could reflect pneumonia, metastatic disease or a combination. Electronically Signed   By: Kevin  Dover M.D.   On: 01/18/2021 10:18   DG Swallowing Func-Speech Pathology  Result Date: 01/14/2021 Formatting of this result is different from the original. Objective Swallowing Evaluation: Type of Study: MBS-Modified Barium Swallow Study  Patient Details Name: Chelsea Mcintosh MRN: [id number] Date of Birth: 05/28/1961 Today's Date: 01/14/2021 Time: SLP Start Time (ACUTE ONLY): 1305 -SLP Stop Time (ACUTE ONLY): 1323 SLP Time Calculation (min) (ACUTE ONLY): 18 min Past Medical History: Past Medical History: Diagnosis Date  Ovarian cancer (HCC)  Past Surgical History: No past surgical history on file. HPI: Pt is a 60 y.o. female who presented to the ED with shortness of breath, and chest and back pain. CXR 7/27: marked severity bilateral multifocal infiltrates. Given the patient's history of stage IV ovarian cancer, sequelae associated with diffuse pulmonary metastasis cannot be  excluded. Mild right basilar atelectasis and/or infiltrate with a very small right pleural effusion. Palliative care has been consulted to help establish GOC since pt was receiving hospice care in [place]. PMH: ovarian cancer with diffuse metastasis previously under hospice care and recently admitted at outside facility in [place], but moved to [place] to be closer to family.  No data recorded Assessment / Plan / Recommendation CHL   IP CLINICAL IMPRESSIONS 01/14/2021 Clinical Impression Pt was seen in radiology suite for modified barium swallow study. Trials of puree solids, regular texture solids, a 57m barium tablet, and thin liquids via cup and straw were administered. Pt's oropharyngeal swallow mechanism was within functional limits. Oral phase was mildly prolonged, but pt stated that she practices mindful eating and therefore enjoys taking  extra time and care to consider the textures and/or various flavors of foods while eating. Secondary swallows were due to mild oral residue, and pt's desire to completely clear the oral cavity but [facility] No instances of penetration or aspiration were demonstrated even when her swallow was challenged. It is recommended that a regular texture diet with thin liquids be continued at this time. Further skilled SLP services are not clinically indicated at this time. SLP Visit Diagnosis Dysphagia, unspecified (R13.10) Attention and concentration deficit following -- Frontal lobe and executive function deficit following -- Impact on safety and function --   CHL IP TREATMENT RECOMMENDATION 01/14/2021 Treatment Recommendations No treatment recommended at this time   No flowsheet data found. CHL IP DIET RECOMMENDATION 01/14/2021 SLP Diet Recommendations Regular solids;Thin liquid Liquid Administration via Cup;Straw Medication Administration Whole meds with liquid Compensations Slow rate;Small sips/bites Postural Changes Seated upright at 90 degrees   CHL IP OTHER RECOMMENDATIONS 01/14/2021 Recommended Consults -- Oral Care Recommendations Oral care BID Other Recommendations --   CHL IP FOLLOW UP RECOMMENDATIONS 01/14/2021 Follow up Recommendations None   CHL IP FREQUENCY AND DURATION 01/14/2021 Speech Therapy Frequency (ACUTE ONLY) min 2x/week Treatment Duration 2 weeks      CHL IP ORAL PHASE 01/14/2021 Oral Phase WFL Oral - Pudding Teaspoon -- Oral - Pudding Cup -- Oral - Honey Teaspoon -- Oral - Honey Cup -- Oral - Nectar Teaspoon -- Oral - Nectar Cup -- Oral - Nectar Straw -- Oral - Thin Teaspoon -- Oral - Thin Cup -- Oral - Thin Straw -- Oral - Puree -- Oral - Mech Soft -- Oral - Regular -- Oral - Multi-Consistency -- Oral - Pill -- Oral Phase - Comment --  CHL IP PHARYNGEAL PHASE 01/14/2021 Pharyngeal Phase WFL Pharyngeal- Pudding Teaspoon -- Pharyngeal -- Pharyngeal- Pudding Cup -- Pharyngeal -- Pharyngeal- Honey  Teaspoon -- Pharyngeal -- Pharyngeal- Honey Cup -- Pharyngeal -- Pharyngeal- Nectar Teaspoon -- Pharyngeal -- Pharyngeal- Nectar Cup -- Pharyngeal -- Pharyngeal- Nectar Straw -- Pharyngeal -- Pharyngeal- Thin Teaspoon -- Pharyngeal -- Pharyngeal- Thin Cup -- Pharyngeal -- Pharyngeal- Thin Straw -- Pharyngeal -- Pharyngeal- Puree -- Pharyngeal -- Pharyngeal- Mechanical Soft -- Pharyngeal -- Pharyngeal- Regular -- Pharyngeal -- Pharyngeal- Multi-consistency -- Pharyngeal -- Pharyngeal- Pill -- Pharyngeal -- Pharyngeal Comment --  CHL IP CERVICAL ESOPHAGEAL PHASE 01/14/2021 Cervical Esophageal Phase WFL Pudding Teaspoon -- Pudding Cup -- Honey Teaspoon -- Honey Cup -- Nectar Teaspoon -- Nectar Cup -- Nectar Straw -- Thin Teaspoon -- Thin Cup -- Thin Straw -- Puree -- Mechanical Soft -- Regular -- Multi-consistency -- Pill -- Cervical Esophageal Comment -- Shanika I. PHardin Negus MHoliday City-Berkeley CFellsmereOffice number [phone] [phone] Marshall7/29/2022, 1:56 PM              ECHOCARDIOGRAM COMPLETE  Result Date: 01/13/2021    ECHOCARDIOGRAM REPORT   Patient Name:   Chelsea TAMMDate of Exam: 01/13/2021 Medical Rec #:  [id number]        Height:       63.0 in Accession #:    [id number]  Weight:       140.0 lb Date of Birth:  06-05-1961          BSA:          1.662 m Patient Age:    21 years          BP:           129/109 mmHg Patient Gender: F                 HR:           120 bpm. Exam Location:  Inpatient Procedure: 2D Echo, Cardiac Doppler and Color Doppler Indications:     Elevated troponin.; R07.9* Chest pain, unspecified; R06.02 SOB  History:         Patient has no prior history of Echocardiogram examinations.                  Signs/Symptoms:Chest Pain, Dyspnea and Shortness of Breath.                  Cancer. Pneumonia.  Sonographer:     Roseanna Rainbow RDCS Referring Phys:  [id number] Burt Diagnosing Phys: Eleonore Chiquito MD  Sonographer Comments: Technically  difficult study due to poor echo windows. IMPRESSIONS  1. There is a small to moderate pericardial effusion (up to 0.9 cm) located near the apex of the LV/RV. There is no RV/RA collapse in diastole. The IVC is collapsing. There is no evidence of pericardial tamponade. Moderate pericardial effusion. The pericardial effusion is surrounding the apex. There is no evidence of cardiac tamponade.  2. Left ventricular ejection fraction, by estimation, is >75%. The left ventricle has hyperdynamic function. The left ventricle has no regional wall motion abnormalities. There is mild asymmetric left ventricular hypertrophy of the basal-septal segment.  Indeterminate diastolic filling due to E-A fusion.  3. Right ventricular systolic function is hyperdynamic. The right ventricular size is normal. There is mildly elevated pulmonary artery systolic pressure. The estimated right ventricular systolic pressure is 48.5 mmHg.  4. The mitral valve is grossly normal. No evidence of mitral valve regurgitation. No evidence of mitral stenosis.  5. The aortic valve is tricuspid. Aortic valve regurgitation is not visualized. No aortic stenosis is present.  6. The inferior vena cava is normal in size with greater than 50% respiratory variability, suggesting right atrial pressure of 3 mmHg. Comparison(s): No prior Echocardiogram. FINDINGS  Left Ventricle: Left ventricular ejection fraction, by estimation, is >75%. The left ventricle has hyperdynamic function. The left ventricle has no regional wall motion abnormalities. The left ventricular internal cavity size was normal in size. There is mild asymmetric left ventricular hypertrophy of the basal-septal segment. Indeterminate diastolic filling due to E-A fusion. Right Ventricle: The right ventricular size is normal. No increase in right ventricular wall thickness. Right ventricular systolic function is hyperdynamic. There is mildly elevated pulmonary artery systolic pressure. The tricuspid  regurgitant velocity is 3.07 m/s, and with an assumed right atrial pressure of 3 mmHg, the estimated right ventricular systolic pressure is 46.2 mmHg. Left Atrium: Left atrial size was normal in size. Right Atrium: Right atrial size was normal in size. Pericardium: There is a small to moderate pericardial effusion (up to 0.9 cm) located near the apex of the LV/RV. There is no RV/RA collapse in diastole. The IVC is collapsing. There is no evidence of pericardial tamponade. A moderately sized pericardial  effusion is present. The pericardial effusion is surrounding the apex. There is no evidence of cardiac tamponade.  Mitral Valve: The mitral valve is grossly normal. No evidence of mitral valve regurgitation. No evidence of mitral valve stenosis. Tricuspid Valve: The tricuspid valve is grossly normal. Tricuspid valve regurgitation is trivial. No evidence of tricuspid stenosis. Aortic Valve: The aortic valve is tricuspid. Aortic valve regurgitation is not visualized. No aortic stenosis is present. Pulmonic Valve: The pulmonic valve was grossly normal. Pulmonic valve regurgitation is not visualized. No evidence of pulmonic stenosis. Aorta: The aortic root and ascending aorta are structurally normal, with no evidence of dilitation. Venous: The inferior vena cava is normal in size with greater than 50% respiratory variability, suggesting right atrial pressure of 3 mmHg. IAS/Shunts: The atrial septum is grossly normal. Additional Comments: There is a small pleural effusion in the left lateral region.  LEFT VENTRICLE PLAX 2D LVIDd:         3.10 cm     Diastology LVIDs:         2.00 cm     LV e' medial:    6.09 cm/s LV PW:         0.90 cm     LV E/e' medial:  8.5 LV IVS:        1.20 cm     LV e' lateral:   10.70 cm/s LVOT diam:     1.90 cm     LV E/e' lateral: 4.8 LV SV:         36 LV SV Index:   22 LVOT Area:     2.84 cm  LV Volumes (MOD) LV vol d, MOD A2C: 32.3 ml LV vol d, MOD A4C: 27.3 ml LV vol s, MOD A2C: 9.0 ml LV vol  s, MOD A4C: 12.0 ml LV SV MOD A2C:     23.3 ml LV SV MOD A4C:     27.3 ml LV SV MOD BP:      24.9 ml RIGHT VENTRICLE            IVC RV S prime:     9.66 cm/s  IVC diam: 1.90 cm TAPSE (M-mode): 2.5 cm LEFT ATRIUM             Index      RIGHT ATRIUM          Index LA diam:        3.20 cm 1.93 cm/m RA Area:     8.20 cm LA Vol (A2C):   9.0 ml  5.39 ml/m RA Volume:   15.60 ml 9.39 ml/m LA Vol (A4C):   8.7 ml  5.25 ml/m LA Biplane Vol: 8.8 ml  5.31 ml/m  AORTIC VALVE LVOT Vmax:   97.40 cm/s LVOT Vmean:  61.800 cm/s LVOT VTI:    0.128 m  AORTA Ao Root diam: 3.20 cm Ao Asc diam:  3.30 cm MITRAL VALVE               TRICUSPID VALVE MV Area (PHT): 6.71 cm    TR Peak grad:   37.7 mmHg MV Decel Time: 113 msec    TR Vmax:        307.00 cm/s MV E velocity: 51.80 cm/s MV A velocity: 80.60 cm/s  SHUNTS MV E/A ratio:  0.64        Systemic VTI:  0.13 m                            Systemic Diam: 1.90 cm [place] O'Neal MD Electronically signed by Clover O'Neal MD   Signature Date/Time: 01/13/2021/1:25:04 PM    Final (Updated)    (Echo, Carotid, EGD, Colonoscopy, ERCP)    Subjective: Patient seen and examined.  Unable to keep up conversation.  Very drowsy.  She tries to talk without any comprehensible words.  Overnight noted to be agitated, Ativan given and with close supervision.  She is calm now.   Discharge Exam: Vitals:   01/25/21 0445 01/25/21 0809  BP: 131/78   Pulse: (!) 125   Resp: 16   Temp: 98.2 F (36.8 C)   SpO2: 98% 95%   Vitals:   01/24/21 1358 01/24/21 1949 01/25/21 0445 01/25/21 0809  BP:   131/78   Pulse:   (!) 125   Resp:   16   Temp:   98.2 F (36.8 C)   TempSrc:   Oral   SpO2: 96% 96% 98% 95%  Weight:      Height:        General: Sick looking.  Frail and debilitated.  Hardly able to keep up conversation.  Incomprehensible speech.  Sleepy and lethargic. Cardiovascular: RRR, S1/S2 + slightly tachycardic. Respiratory: Mostly upper airway conducted sounds.  Comfortable on current  medications. Abdominal: Soft.  Nontender.  Distended.  No stool in colostomy.    The results of significant diagnostics from this hospitalization (including imaging, microbiology, ancillary and laboratory) are listed below for reference.     Microbiology: Recent Results (from the past 240 hour(s))  SARS CORONAVIRUS 2 (TAT 6-24 HRS) Nasopharyngeal Nasopharyngeal Swab     Status: None   Collection Time: 01/18/21 12:50 PM   Specimen: Nasopharyngeal Swab  Result Value Ref Range Status   SARS Coronavirus 2 NEGATIVE NEGATIVE Final    Comment: (NOTE) SARS-CoV-2 target nucleic acids are NOT DETECTED.  The SARS-CoV-2 RNA is generally detectable in upper and lower respiratory specimens during the acute phase of infection. Negative results do not preclude SARS-CoV-2 infection, do not rule out co-infections with other pathogens, and should not be used as the sole basis for treatment or other patient management decisions. Negative results must be combined with clinical observations, patient history, and epidemiological information. The expected result is Negative.  Fact Sheet for Patients: https://www.fda.gov/media/138098/download  Fact Sheet for Healthcare Providers: https://www.fda.gov/media/138095/download  This test is not yet approved or cleared by the [place] FDA and  has been authorized for detection and/or diagnosis of SARS-CoV-2 by FDA under an Emergency Use Authorization (EUA). This EUA will remain  in effect (meaning this test can be used) for the duration of the COVID-19 declaration under Se ction 564(b)(1) of the Act, 21 U.S.C. section 360bbb-3(b)(1), unless the authorization is terminated or revoked sooner.  Performed at [facility], [address] [place], [place] [address]   MRSA Next Gen by PCR, Nasal     Status: None   Collection Time: 01/18/21  4:25 PM   Specimen: Nasal Mucosa; Nasal Swab  Result Value Ref Range Status   MRSA by PCR Next Gen NOT  DETECTED NOT DETECTED Final    Comment: (NOTE) The GeneXpert MRSA Assay (FDA approved for NASAL specimens only), is one component of a comprehensive MRSA colonization surveillance program. It is not intended to diagnose MRSA infection nor to guide or monitor treatment for MRSA infections. Test performance is not FDA approved in patients less than 2 years old. Performed at [facility], [address] [place], [place] [address]      Labs: BNP (last 3 results) Recent Labs    01/12/21   2207 01/18/21 1004  BNP 73.4 16.3   Basic Metabolic Panel: No results for input(s): NA, K, CL, CO2, GLUCOSE, BUN, CREATININE, CALCIUM, MG, PHOS in the last 168 hours. Liver Function Tests: No results for input(s): AST, ALT, ALKPHOS, BILITOT, PROT, ALBUMIN in the last 168 hours. No results for input(s): LIPASE, AMYLASE in the last 168 hours. No results for input(s): AMMONIA in the last 168 hours. CBC: No results for input(s): WBC, NEUTROABS, HGB, HCT, MCV, PLT in the last 168 hours. Cardiac Enzymes: No results for input(s): CKTOTAL, CKMB, CKMBINDEX, TROPONINI in the last 168 hours. BNP: Invalid input(s): POCBNP CBG: Recent Labs  Lab 01/18/21 1631  GLUCAP 112*   D-Dimer No results for input(s): DDIMER in the last 72 hours. Hgb A1c No results for input(s): HGBA1C in the last 72 hours. Lipid Profile No results for input(s): CHOL, HDL, LDLCALC, TRIG, CHOLHDL, LDLDIRECT in the last 72 hours. Thyroid function studies No results for input(s): TSH, T4TOTAL, T3FREE, THYROIDAB in the last 72 hours.  Invalid input(s): FREET3 Anemia work up No results for input(s): VITAMINB12, FOLATE, FERRITIN, TIBC, IRON, RETICCTPCT in the last 72 hours. Urinalysis No results found for: COLORURINE, APPEARANCEUR, LABSPEC, PHURINE, GLUCOSEU, HGBUR, BILIRUBINUR, KETONESUR, PROTEINUR, UROBILINOGEN, NITRITE, LEUKOCYTESUR Sepsis Labs Invalid input(s): PROCALCITONIN,  WBC,   LACTICIDVEN Microbiology Recent Results (from the past 240 hour(s))  SARS CORONAVIRUS 2 (TAT 6-24 HRS) Nasopharyngeal Nasopharyngeal Swab     Status: None   Collection Time: 01/18/21 12:50 PM   Specimen: Nasopharyngeal Swab  Result Value Ref Range Status   SARS Coronavirus 2 NEGATIVE NEGATIVE Final    Comment: (NOTE) SARS-CoV-2 target nucleic acids are NOT DETECTED.  The SARS-CoV-2 RNA is generally detectable in upper and lower respiratory specimens during the acute phase of infection. Negative results do not preclude SARS-CoV-2 infection, do not rule out co-infections with other pathogens, and should not be used as the sole basis for treatment or other patient management decisions. Negative results must be combined with clinical observations, patient history, and epidemiological information. The expected result is Negative.  Fact Sheet for Patients: https://www.fda.gov/media/138098/download  Fact Sheet for Healthcare Providers: https://www.fda.gov/media/138095/download  This test is not yet approved or cleared by the [place] FDA and  has been authorized for detection and/or diagnosis of SARS-CoV-2 by FDA under an Emergency Use Authorization (EUA). This EUA will remain  in effect (meaning this test can be used) for the duration of the COVID-19 declaration under Se ction 564(b)(1) of the Act, 21 U.S.C. section 360bbb-3(b)(1), unless the authorization is terminated or revoked sooner.  Performed at [facility], [address] [place], [place] [address]   MRSA Next Gen by PCR, Nasal     Status: None   Collection Time: 01/18/21  4:25 PM   Specimen: Nasal Mucosa; Nasal Swab  Result Value Ref Range Status   MRSA by PCR Next Gen NOT DETECTED NOT DETECTED Final    Comment: (NOTE) The GeneXpert MRSA Assay (FDA approved for NASAL specimens only), is one component of a comprehensive MRSA colonization surveillance program. It is not intended to diagnose MRSA infection  nor to guide or monitor treatment for MRSA infections. Test performance is not FDA approved in patients less than 2 years old. Performed at [facility], [address] , [place] [address]      Time coordinating discharge:  32 minutes  SIGNED:    , MD  [facility] 01/25/2021, 10:23 AM  

## 2021-01-25 NOTE — Progress Notes (Signed)
Daily Progress Note   Patient Name: Chelsea Mcintosh       Date: 01/25/2021 DOB: 12-Nov-1960  Age: 60 y.o. MRN#: [id number] Attending Physician: Barb Merino, MD Primary Care Physician: System, Provider Not In Admit Date: 01/18/2021  Reason for Consultation/Follow-up: Terminal Care  Subjective: I saw and examined Ms. Malecha this afternoon.  Discussed with bedside RN.   Ms. Kasparian has been more confused and agitated today.  Discussed this is likely terminal agitation and has been controlled with ativan.  Plan for transition to residential hospice this evening.  Length of Stay: 7  Current Medications: Scheduled Meds:   chlorhexidine  15 mL Mouth Rinse BID   escitalopram  10 mg Oral Daily   fentaNYL  1 patch Transdermal Q72H   fluticasone furoate-vilanterol  1 puff Inhalation Daily   furosemide  40 mg Intravenous BID   ipratropium-albuterol  3 mL Nebulization TID   mouth rinse  15 mL Mouth Rinse q12n4p   polyvinyl alcohol  1 drop Both Eyes TID   senna-docusate  1 tablet Oral BID    Continuous Infusions:  HYDROmorphone 1.25 mg/hr (01/25/21 0021)    PRN Meds: acetaminophen **OR** acetaminophen, antiseptic oral rinse, glycopyrrolate **OR** glycopyrrolate **OR** glycopyrrolate, guaiFENesin, haloperidol **OR** haloperidol **OR** haloperidol lactate, HYDROmorphone, ipratropium-albuterol, lip balm, LORazepam, ondansetron (ZOFRAN) IV  Physical Exam         General: Somnolent. Does not arouse to gentle verbal or tactile stimulation Heart: Tachycardic Lungs: Decreased air movement Abdomen: Soft, nontender, nondistended, positive bowel sounds.   Ext: No significant edema Skin: Warm and dry  Vital Signs: BP 131/78 (BP Location: Right Arm)   Pulse (!) 125   Temp 98.2 F (36.8 C)  (Oral)   Resp 16   Ht '5\' 4"'$  (1.626 m)   Wt 65.9 kg   SpO2 93%   BMI 24.94 kg/m  SpO2: SpO2: 93 % O2 Device: O2 Device: Nasal Cannula O2 Flow Rate: O2 Flow Rate (L/min): 4 L/min  Intake/output summary:  Intake/Output Summary (Last 24 hours) at 01/25/2021 1621 Last data filed at 01/25/2021 1443 Gross per 24 hour  Intake 123.07 ml  Output 1100 ml  Net -976.93 ml    LBM: Last BM Date:  (PTA, no stool in pouch) Baseline Weight: Weight: 65.9 kg Most recent weight: Weight: 65.9 kg  PPS 20% Palliative Assessment/Data:      Patient Active Problem List   Diagnosis Date Noted   End of life care 01/20/2021   Cancer associated pain 01/19/2021   Acute hypoxemic respiratory failure ([place]) 01/19/2021   Hypoxia 01/18/2021   Metastatic malignant neoplasm ([place])    HCAP (healthcare-associated pneumonia) 01/13/2021    Palliative Care Assessment & Plan   Patient Profile:    Assessment:  60 y.o. 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  now on comfort measures.   Recommendations/Plan: Continue comfort care.  Increased agitation (likely terminal agitation) has been responding well to prn meds.  Continue same. Plan for transition to [facility] this evening.  Goals of Care and Additional Recommendations: Limitations on Scope of Treatment: Full Comfort Care  Code Status:    Code Status Orders  (From admission, onward)           Start     Ordered   01/21/21 1350  Do not attempt resuscitation (DNR)  Continuous       Question Answer Comment  In the event of cardiac or respiratory ARREST Do not call a "code blue"   In the event of cardiac or respiratory ARREST Do not perform Intubation, CPR,  defibrillation or ACLS   In the event of cardiac or respiratory ARREST Use medication by any route, position, wound care, and other measures to relive pain and suffering. May use oxygen, suction and manual treatment of airway obstruction as needed for comfort.      01/21/21 1350           Code Status History     Date Active Date Inactive Code Status Order ID Comments User Context   01/20/2021 1431 01/21/2021 1350 DNR [id number]  Pershing Proud, NP Inpatient   01/18/2021 1420 01/20/2021 1430 Full Code [id number]  Lequita Halt, MD ED   01/13/2021 0214 01/17/2021 2025 Full Code [id number]  Kayleen Memos, DO ED       Prognosis:  < 2 weeks  Discharge Planning: Hospice facility  Care plan was discussed with hospice liaison and bedside RN.  Thank you for allowing the Palliative Medicine Team to assist in the care of this patient.   Total Time 20 Prolonged Time Billed  no    Greater than 50%  of this time was spent counseling and coordinating care related to the above assessment and plan.  Micheline Rough, MD  Please contact Palliative Medicine Team phone at [phone] for questions and concerns.

## 2021-01-26 DIAGNOSIS — R451 Restlessness and agitation: Secondary | ICD-10-CM

## 2021-01-26 MED ORDER — HYDROMORPHONE HCL PF 10 MG/ML IJ SOLN
1.5000 mg/h | INTRAMUSCULAR | Status: DC
  Administered 2021-01-26 – 2021-01-27 (×2): 1.5 mg/h via INTRAVENOUS
  Filled 2021-01-26 (×4): qty 2.5

## 2021-01-26 MED ORDER — GLYCOPYRROLATE 0.2 MG/ML IJ SOLN
0.4000 mg | Freq: Three times a day (TID) | INTRAMUSCULAR | Status: DC
  Administered 2021-01-26 – 2021-01-27 (×4): 0.4 mg via INTRAVENOUS
  Filled 2021-01-26 (×4): qty 2

## 2021-01-26 MED ORDER — LORAZEPAM 2 MG/ML IJ SOLN: 1.0000 mg | INTRAMUSCULAR | Status: DC | PRN

## 2021-01-26 MED ORDER — LORAZEPAM 2 MG/ML IJ SOLN
1.0000 mg | Freq: Four times a day (QID) | INTRAMUSCULAR | Status: DC
  Administered 2021-01-26 – 2021-01-27 (×5): 1 mg via INTRAVENOUS
  Filled 2021-01-26 (×5): qty 1

## 2021-01-26 NOTE — Progress Notes (Signed)
PROGRESS NOTE    Chelsea Mcintosh  [id number] DOB: 03-Dec-1960 DOA: 01/18/2021 PCP: System, Provider Not In    Brief Narrative:  Stage IV ovarian cancer with diffuse lung metastasis, COPD, end-of-life care in the hospital plan for inpatient hospice transfer 8/9, logistically unable and he stayed in the hospital.   Assessment & Plan:   Principal Problem:   Acute hypoxemic respiratory failure ([place]) Active Problems:   Hypoxia   Metastatic malignant neoplasm ([place])   Cancer associated pain   End of life care  Patient remains at terminal days of her life, not eating well. Severe pain and delirium currently managed with Dilaudid infusion, intermittent haloperidol and Ativan. Will continue to provide end-of-life care. Awaiting family decision to transfer to inpatient hospice.   DVT prophylaxis:   Comfort care   Code Status: Comfort care Family Communication: None Disposition Plan: Status is: Inpatient  Remains inpatient appropriate because:Unsafe d/c plan  Dispo: The patient is from: Home              Anticipated d/c is to:  Hospice home              Patient currently is not medically stable to d/c.  Stable only to hospice level care   Difficult to place patient No         Consultants:  Oncology Palliative Hospice  Procedures:  None  Antimicrobials:  None   Subjective: Patient seen and examined.  She was tired lethargic and closing her eyes.  Looked comfortable.  Did not wake her up.  Objective: Vitals:   01/25/21 1420 01/25/21 1958 01/26/21 0545 01/26/21 0908  BP:   138/81   Pulse:   (!) 135   Resp:   17   Temp:   97.7 F (36.5 C)   TempSrc:   Oral   SpO2: 93% 93% 92% 91%  Weight:      Height:        Intake/Output Summary (Last 24 hours) at 01/26/2021 1114 Last data filed at 01/26/2021 0640 Gross per 24 hour  Intake --  Output 1200 ml  Net -1200 ml   Filed Weights   01/18/21 1615  Weight: 65.9 kg    Examination:  Sick looking, tired  and lethargic.  Unable to keep her eyes open. On minimal oxygen for support.    Data Reviewed: I have personally reviewed following labs and imaging studies  CBC: No results for input(s): WBC, NEUTROABS, HGB, HCT, MCV, PLT in the last 168 hours. Basic Metabolic Panel: No results for input(s): NA, K, CL, CO2, GLUCOSE, BUN, CREATININE, CALCIUM, MG, PHOS in the last 168 hours. GFR: Estimated Creatinine Clearance: 68.1 mL/min (A) (by C-G formula based on SCr of 0.41 mg/dL (L)). Liver Function Tests: No results for input(s): AST, ALT, ALKPHOS, BILITOT, PROT, ALBUMIN in the last 168 hours. No results for input(s): LIPASE, AMYLASE in the last 168 hours. No results for input(s): AMMONIA in the last 168 hours. Coagulation Profile: No results for input(s): INR, PROTIME in the last 168 hours. Cardiac Enzymes: No results for input(s): CKTOTAL, CKMB, CKMBINDEX, TROPONINI in the last 168 hours. BNP (last 3 results) No results for input(s): PROBNP in the last 8760 hours. HbA1C: No results for input(s): HGBA1C in the last 72 hours. CBG: No results for input(s): GLUCAP in the last 168 hours. Lipid Profile: No results for input(s): CHOL, HDL, LDLCALC, TRIG, CHOLHDL, LDLDIRECT in the last 72 hours. Thyroid Function Tests: No results for input(s): TSH, T4TOTAL, FREET4, T3FREE,  THYROIDAB in the last 72 hours. Anemia Panel: No results for input(s): VITAMINB12, FOLATE, FERRITIN, TIBC, IRON, RETICCTPCT in the last 72 hours. Sepsis Labs: No results for input(s): PROCALCITON, LATICACIDVEN in the last 168 hours.  Recent Results (from the past 240 hour(s))  SARS CORONAVIRUS 2 (TAT 6-24 HRS) Nasopharyngeal Nasopharyngeal Swab     Status: None   Collection Time: 01/18/21 12:50 PM   Specimen: Nasopharyngeal Swab  Result Value Ref Range Status   SARS Coronavirus 2 NEGATIVE NEGATIVE Final    Comment: (NOTE) SARS-CoV-2 target nucleic acids are NOT DETECTED.  The SARS-CoV-2 RNA is generally detectable in  upper and lower respiratory specimens during the acute phase of infection. Negative results do not preclude SARS-CoV-2 infection, do not rule out co-infections with other pathogens, and should not be used as the sole basis for treatment or other patient management decisions. Negative results must be combined with clinical observations, patient history, and epidemiological information. The expected result is Negative.  Fact Sheet for Patients: SugarRoll.be  Fact Sheet for Healthcare Providers: https://www.woods-mathews.com/  This test is not yet approved or cleared by the [place] FDA and  has been authorized for detection and/or diagnosis of SARS-CoV-2 by FDA under an Emergency Use Authorization (EUA). This EUA will remain  in effect (meaning this test can be used) for the duration of the COVID-19 declaration under Se ction 564(b)(1) of the Act, 21 U.S.C. section 360bbb-3(b)(1), unless the authorization is terminated or revoked sooner.  Performed at [facility], [place] [address], [place], [place] [address]   MRSA Next Gen by PCR, Nasal     Status: None   Collection Time: 01/18/21  4:25 PM   Specimen: Nasal Mucosa; Nasal Swab  Result Value Ref Range Status   MRSA by PCR Next Gen NOT DETECTED NOT DETECTED Final    Comment: (NOTE) The GeneXpert MRSA Assay (FDA approved for NASAL specimens only), is one component of a comprehensive MRSA colonization surveillance program. It is not intended to diagnose MRSA infection nor to guide or monitor treatment for MRSA infections. Test performance is not FDA approved in patients less than 6 years old. Performed at [facility], [place] [address], [place], [place] [address]          Radiology Studies: No results found.      Scheduled Meds:  chlorhexidine  15 mL Mouth Rinse BID   escitalopram  10 mg Oral Daily   fentaNYL  1 patch Transdermal Q72H   furosemide   40 mg Intravenous BID   ipratropium-albuterol  3 mL Nebulization TID   mouth rinse  15 mL Mouth Rinse q12n4p   polyvinyl alcohol  1 drop Both Eyes TID   senna-docusate  1 tablet Oral BID   Continuous Infusions:  HYDROmorphone 1.25 mg/hr (01/25/21 1715)     LOS: 8 days    Time spent: 20 minutes    Barb Merino, MD [facility] Pager [phone]

## 2021-01-26 NOTE — Progress Notes (Signed)
[facility]) hospital liaison note  Visited pt at bedside.  Pt appeared very uncomfortable, with furrowed brow and clenched fists.  Bedside RN Maudie Mercury gave bolus of dilaudid and PRN ativan.  PMT made aware.  New orders placed to address unmanaged symptoms.  Spoke with pt's sister, Judeen Hammans, by phone; unable to reach pt's mother or to leave a voice mail.  Shared current status of pt. Plan made to reassess tomorrow.  TOC Alinda Sierras made aware.  Thank you for the opportunity to participate in this patient's care.  Domenic Moras, BSN, RN [facility] Liaison (listed on [place] under Hospice/[facility])    [phone] (24h on call)

## 2021-01-27 NOTE — TOC Transition Note (Signed)
Transition of Care [facility]) - CM/SW Discharge Note   Patient Details  Name: Chelsea Mcintosh MRN: [id number] Date of Birth: 1961-04-30  [facility]) CM/SW Contact:  Lynnell Catalan, RN Phone Number: 01/27/2021, 1:50 PM   Clinical Narrative:    Pt to dc to [facility] today. [facility] contacted for transport. Yellow DNR on the chart for transport. RN to call report to [phone].    Discharge Planning Services: CM Consult                Readmission Risk Interventions No flowsheet data found.

## 2021-01-27 NOTE — Progress Notes (Addendum)
[employer] [facility]) hospital liaison note  Addenedum: 1:05pm: Consents are complete at this time.  TOC aware.  Please transport at your earliest convenience.  Visited pt at bedside.  Pt appears comfortable, no nonverbal signs of pain noted. Pt opens eye to voice and gentle touch but speech is not clear.   Spoke with pt's mother by phone.  Bed has been offered  for transfer today.  Pt's mother will do consents in person at noon at [facility].   Please call report to [phone].  Please be sure a signed DNR is ready to transport with the pt.  TOC Alinda Sierras made aware.  Thank you for the opportunity to participate in this patient's care.  Domenic Moras, BSN, RN [facility] Liaison (listed on [place] under Hospice/[facility])    [phone] (24h on call)

## 2021-01-27 NOTE — Progress Notes (Signed)
Patient discharged to [facility] via [place]. Report called to [place] at [facility]. Bolus given prior to transport.

## 2021-01-27 NOTE — Progress Notes (Signed)
Daily Progress Note   Patient Name: Chelsea Mcintosh       Date: 01/27/2021 DOB: 02/07/1961  Age: 60 y.o. MRN#: [id number] Attending Physician: Barb Merino, MD Primary Care Physician: System, Provider Not In Admit Date: 01/18/2021  Reason for Consultation/Follow-up: Terminal Care  Subjective: Received call from [facility] liaison that [name] appeared to be more uncomfortable at time she saw her today.  I saw and examined [name] and discussed with bedside RN.  Chart reviewed including review of MAR.  On arrival, she awakens and opens eyes but mumbles and cannot really participate in conversation.  She is restless and seems to agree when asked about pain.  Length of Stay: 9  Current Medications: Scheduled Meds:   chlorhexidine  15 mL Mouth Rinse BID   escitalopram  10 mg Oral Daily   fentaNYL  1 patch Transdermal Q72H   furosemide  40 mg Intravenous BID   glycopyrrolate  0.4 mg Intravenous TID   ipratropium-albuterol  3 mL Nebulization TID   LORazepam  1 mg Intravenous Q6H   mouth rinse  15 mL Mouth Rinse q12n4p   polyvinyl alcohol  1 drop Both Eyes TID   senna-docusate  1 tablet Oral BID    Continuous Infusions:  HYDROmorphone 1.5 mg/hr (01/26/21 1606)    PRN Meds: acetaminophen **OR** acetaminophen, antiseptic oral rinse, glycopyrrolate **OR** glycopyrrolate **OR** glycopyrrolate, guaiFENesin, haloperidol **OR** haloperidol **OR** haloperidol lactate, HYDROmorphone, ipratropium-albuterol, lip balm, LORazepam, ondansetron (ZOFRAN) IV  Physical Exam         General: Sleeping.  Arouses with stimulation but not really able to participate in conversation. Heart: Tachycardic Lungs: Decreased air movement Abdomen: Soft, nontender, nondistended, positive bowel sounds.    Ext: No significant edema Skin: Warm and dry  Vital Signs: BP 138/81   Pulse (!) 135   Temp 97.7 F (36.5 C) (Oral)   Resp 17   Ht '5\' 4"'$  (1.626 m)   Wt 65.9 kg   SpO2 91%   BMI 24.94 kg/m  SpO2: SpO2: 91 % O2 Device: O2 Device: Nasal Cannula O2 Flow Rate: O2 Flow Rate (L/min): 2 L/min  Intake/output summary: No intake or output data in the 24 hours ending 01/27/21 0714  LBM: Last BM Date:  (PTA, no stool in pouch) Baseline Weight: Weight: 65.9 kg Most recent weight: Weight:  65.9 kg      PPS 20% Palliative Assessment/Data:      Patient Active Problem List   Diagnosis Date Noted   End of life care 01/20/2021   Cancer associated pain 01/19/2021   Acute hypoxemic respiratory failure ([place]) 01/19/2021   Hypoxia 01/18/2021   Metastatic malignant neoplasm ([place])    HCAP (healthcare-associated pneumonia) 01/13/2021    Palliative Care Assessment & Plan   Patient Profile:    Assessment:  60 y.o. 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  now on comfort measures.   Recommendations/Plan: Continue comfort care.  Increased agitation (likely terminal agitation) that continues to respond l to prn meds.   Reviewed MAR and prn usage and will increase basal rate of dilaudid to 1.'5mg'$  per hour. Schedule ativan every 6 hours with additional dose every 3 hours as needed. Plan has been for transition to [facility], however, I am concerned that she may be reaching the point where she is not stable for transport.  Will plan to reassess daily.  Goals of Care and Additional Recommendations: Limitations on Scope of Treatment: Full Comfort Care  Code Status:    Code Status Orders  (From admission, onward)            Start     Ordered   01/21/21 1350  Do not attempt resuscitation (DNR)  Continuous       Question Answer Comment  In the event of cardiac or respiratory ARREST Do not call a "code blue"   In the event of cardiac or respiratory ARREST Do not perform Intubation, CPR, defibrillation or ACLS   In the event of cardiac or respiratory ARREST Use medication by any route, position, wound care, and other measures to relive pain and suffering. May use oxygen, suction and manual treatment of airway obstruction as needed for comfort.      01/21/21 1350           Code Status History     Date Active Date Inactive Code Status Order ID Comments User Context   01/20/2021 1431 01/21/2021 1350 DNR [id number]  Pershing Proud, NP Inpatient   01/18/2021 1420 01/20/2021 1430 Full Code [id number]  Lequita Halt, MD ED   01/13/2021 0214 01/17/2021 2025 Full Code [id number]  Kayleen Memos, DO ED       Prognosis:  < 2 weeks  Discharge Planning: Hospice facility  Care plan was discussed with hospice liaison and bedside RN.  Thank you for allowing the Palliative Medicine Team to assist in the care of this patient.  Start time: 1330 End time: 1400  Total Time 30 Prolonged Time Billed  no    Greater than 50%  of this time was spent counseling and coordinating care related to the above assessment and plan.  Micheline Rough, MD  Please contact Palliative Medicine Team phone at [phone] for questions and concerns.

## 2021-01-27 NOTE — Progress Notes (Signed)
PROGRESS NOTE    Chelsea Mcintosh  [id number] DOB: [birth year] DOA: 01/18/2021 PCP: System, Provider Not In    Brief Narrative:  Stage IV ovarian cancer with diffuse lung metastasis, COPD, end-of-life care in the hospital plan for inpatient hospice transfer 8/9, logistically unable and she stayed in the hospital.   Assessment & Plan:   Principal Problem:   Acute hypoxemic respiratory failure ([place]) Active Problems:   Hypoxia   Metastatic malignant neoplasm ([place])   Cancer associated pain   End of life care  Patient remains at terminal days of her life, Severe pain and delirium currently managed with Dilaudid infusion, intermittent haloperidol and Ativan. Will continue to provide end-of-life care. Can potentially transfer to inpatient hospice today if bed available.    DVT prophylaxis:   Comfort care   Code Status: Comfort care Family Communication: None Disposition Plan: Status is: Inpatient  Remains inpatient appropriate because:Unsafe d/c plan  Dispo: The patient is from: Home              Anticipated d/c is to:  Hospice home              Patient currently is not medically stable to d/c.  Stable only to hospice level care   Difficult to place patient No         Consultants:  Oncology Palliative Hospice  Procedures:  None  Antimicrobials:  None   Subjective: Patient seen and examined.  Tired and lethargic. Incomprehensible speech.  Objective: Vitals:   01/25/21 1958 01/26/21 0545 01/26/21 0908 01/27/21 0722  BP:  138/81    Pulse:  (!) 135    Resp:  17    Temp:  97.7 F (36.5 C)    TempSrc:  Oral    SpO2: 93% 92% 91% 90%  Weight:      Height:       No intake or output data in the 24 hours ending 01/27/21 1428  Filed Weights   01/18/21 1615  Weight: 65.9 kg    Examination:  Sick looking, tired and lethargic.  Unable to keep her eyes open. On minimal oxygen for support.    Data Reviewed: I have personally reviewed following labs  and imaging studies  CBC: No results for input(s): WBC, NEUTROABS, HGB, HCT, MCV, PLT in the last 168 hours. Basic Metabolic Panel: No results for input(s): NA, K, CL, CO2, GLUCOSE, BUN, CREATININE, CALCIUM, MG, PHOS in the last 168 hours. GFR: Estimated Creatinine Clearance: 68.1 mL/min (A) (by C-G formula based on SCr of 0.41 mg/dL (L)). Liver Function Tests: No results for input(s): AST, ALT, ALKPHOS, BILITOT, PROT, ALBUMIN in the last 168 hours. No results for input(s): LIPASE, AMYLASE in the last 168 hours. No results for input(s): AMMONIA in the last 168 hours. Coagulation Profile: No results for input(s): INR, PROTIME in the last 168 hours. Cardiac Enzymes: No results for input(s): CKTOTAL, CKMB, CKMBINDEX, TROPONINI in the last 168 hours. BNP (last 3 results) No results for input(s): PROBNP in the last 8760 hours. HbA1C: No results for input(s): HGBA1C in the last 72 hours. CBG: No results for input(s): GLUCAP in the last 168 hours. Lipid Profile: No results for input(s): CHOL, HDL, LDLCALC, TRIG, CHOLHDL, LDLDIRECT in the last 72 hours. Thyroid Function Tests: No results for input(s): TSH, T4TOTAL, FREET4, T3FREE, THYROIDAB in the last 72 hours. Anemia Panel: No results for input(s): VITAMINB12, FOLATE, FERRITIN, TIBC, IRON, RETICCTPCT in the last 72 hours. Sepsis Labs: No results for input(s): PROCALCITON, LATICACIDVEN  in the last 168 hours.  Recent Results (from the past 240 hour(s))  SARS CORONAVIRUS 2 (TAT 6-24 HRS) Nasopharyngeal Nasopharyngeal Swab     Status: None   Collection Time: 01/18/21 12:50 PM   Specimen: Nasopharyngeal Swab  Result Value Ref Range Status   SARS Coronavirus 2 NEGATIVE NEGATIVE Final    Comment: (NOTE) SARS-CoV-2 target nucleic acids are NOT DETECTED.  The SARS-CoV-2 RNA is generally detectable in upper and lower respiratory specimens during the acute phase of infection. Negative results do not preclude SARS-CoV-2 infection, do not rule  out co-infections with other pathogens, and should not be used as the sole basis for treatment or other patient management decisions. Negative results must be combined with clinical observations, patient history, and epidemiological information. The expected result is Negative.  Fact Sheet for Patients: SugarRoll.be  Fact Sheet for Healthcare Providers: https://www.woods-mathews.com/  This test is not yet approved or cleared by the [place] FDA and  has been authorized for detection and/or diagnosis of SARS-CoV-2 by FDA under an Emergency Use Authorization (EUA). This EUA will remain  in effect (meaning this test can be used) for the duration of the COVID-19 declaration under Se ction 564(b)(1) of the Act, 21 U.S.C. section 360bbb-3(b)(1), unless the authorization is terminated or revoked sooner.  Performed at [facility], [place] [address], [place], [place] [address]   MRSA Next Gen by PCR, Nasal     Status: None   Collection Time: 01/18/21  4:25 PM   Specimen: Nasal Mucosa; Nasal Swab  Result Value Ref Range Status   MRSA by PCR Next Gen NOT DETECTED NOT DETECTED Final    Comment: (NOTE) The GeneXpert MRSA Assay (FDA approved for NASAL specimens only), is one component of a comprehensive MRSA colonization surveillance program. It is not intended to diagnose MRSA infection nor to guide or monitor treatment for MRSA infections. Test performance is not FDA approved in patients less than 29 years old. Performed at [facility], [place] [address] [place], [place] [address]          Radiology Studies: No results found.      Scheduled Meds:  chlorhexidine  15 mL Mouth Rinse BID   escitalopram  10 mg Oral Daily   fentaNYL  1 patch Transdermal Q72H   furosemide  40 mg Intravenous BID   glycopyrrolate  0.4 mg Intravenous TID   ipratropium-albuterol  3 mL Nebulization TID   LORazepam  1 mg Intravenous  Q6H   mouth rinse  15 mL Mouth Rinse q12n4p   polyvinyl alcohol  1 drop Both Eyes TID   senna-docusate  1 tablet Oral BID   Continuous Infusions:  HYDROmorphone 1.5 mg/hr (01/27/21 1153)     LOS: 9 days    Time spent: 20 minutes    Barb Merino, MD [facility] Pager [phone]

## 2021-01-27 NOTE — Progress Notes (Signed)
Daily Progress Note   Patient Name: Chelsea Mcintosh       Date: 01/27/2021 DOB: 12-21-1960  Age: 60 y.o. MRN#: [id number] Attending Physician: Barb Merino, MD Primary Care Physician: System, Provider Not In Admit Date: 01/18/2021  Reason for Consultation/Follow-up: Terminal Care  Subjective: I saw and examined Chelsea Mcintosh today.  She was lying in bed in no distress.  She opened her eyes and mumbled but did not really participate in meaningful conversation.  Discussed with bedside RN who reports she has appeared comfortable today.  Also discussed with hospice liaison and plan for mother to complete paperwork for transition to [facility] today at noon.  Length of Stay: 9  Current Medications: Scheduled Meds:   chlorhexidine  15 mL Mouth Rinse BID   escitalopram  10 mg Oral Daily   fentaNYL  1 patch Transdermal Q72H   furosemide  40 mg Intravenous BID   glycopyrrolate  0.4 mg Intravenous TID   ipratropium-albuterol  3 mL Nebulization TID   LORazepam  1 mg Intravenous Q6H   mouth rinse  15 mL Mouth Rinse q12n4p   polyvinyl alcohol  1 drop Both Eyes TID   senna-docusate  1 tablet Oral BID    Continuous Infusions:  HYDROmorphone 1.5 mg/hr (01/27/21 1153)    PRN Meds: acetaminophen **OR** acetaminophen, antiseptic oral rinse, glycopyrrolate **OR** glycopyrrolate **OR** glycopyrrolate, guaiFENesin, haloperidol **OR** haloperidol **OR** haloperidol lactate, HYDROmorphone, ipratropium-albuterol, lip balm, LORazepam, ondansetron (ZOFRAN) IV  Physical Exam         General: Sleeping.  Arouses with stimulation but not really able to participate in conversation. Heart: Tachycardic Lungs: Decreased air movement Abdomen: Soft, nontender, nondistended, positive bowel sounds.   Ext: No  significant edema Skin: Warm and dry  Vital Signs: BP 138/81   Pulse (!) 135   Temp 97.7 F (36.5 C) (Oral)   Resp 17   Ht '5\' 4"'$  (1.626 m)   Wt 65.9 kg   SpO2 90%   BMI 24.94 kg/m  SpO2: SpO2: 90 % O2 Device: O2 Device: Nasal Cannula O2 Flow Rate: O2 Flow Rate (L/min): 4 L/min  Intake/output summary: No intake or output data in the 24 hours ending 01/27/21 1317  LBM: Last BM Date:  (PTA, no stool in pouch) Baseline Weight: Weight: 65.9 kg Most recent weight: Weight: 65.9 kg  PPS 10% Palliative Assessment/Data:      Patient Active Problem List   Diagnosis Date Noted   End of life care 01/20/2021   Cancer associated pain 01/19/2021   Acute hypoxemic respiratory failure ([place]) 01/19/2021   Hypoxia 01/18/2021   Metastatic malignant neoplasm ([place])    HCAP (healthcare-associated pneumonia) 01/13/2021    Palliative Care Assessment & Plan   Patient Profile:    Assessment:  60 y.o. 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  now on comfort measures.   Recommendations/Plan: Continue comfort care.  Increased agitation (likely terminal agitation) that continues to respond l to prn meds.   Reviewed MAR and as needed usage.  Continue basal rate of dilaudid to 1.'5mg'$  per hour. Continue scheduled ativan every 6 hours with additional dose every 3 hours as needed. Plan for transition to [facility] for end-of-life care.  She does appear to be stable for transport based on my examination today.  Goals of Care and Additional Recommendations: Limitations on Scope of Treatment: Full Comfort Care  Code Status:    Code Status Orders  (From admission, onward)           Start     Ordered   01/21/21  1350  Do not attempt resuscitation (DNR)  Continuous       Question Answer Comment  In the event of cardiac or respiratory ARREST Do not call a "code blue"   In the event of cardiac or respiratory ARREST Do not perform Intubation, CPR, defibrillation or ACLS   In the event of cardiac or respiratory ARREST Use medication by any route, position, wound care, and other measures to relive pain and suffering. May use oxygen, suction and manual treatment of airway obstruction as needed for comfort.      01/21/21 1350           Code Status History     Date Active Date Inactive Code Status Order ID Comments User Context   01/20/2021 1431 01/21/2021 1350 DNR [id number]  Pershing Proud, NP Inpatient   01/18/2021 1420 01/20/2021 1430 Full Code [id number]  Lequita Halt, MD ED   01/13/2021 0214 01/17/2021 2025 Full Code [id number]  Kayleen Memos, DO ED       Prognosis:  < 2 weeks  Discharge Planning: Hospice facility  Care plan was discussed with hospice liaison and bedside RN.  Thank you for allowing the Palliative Medicine Team to assist in the care of this patient.  Total Time 20 Prolonged Time Billed  no    Greater than 50%  of this time was spent counseling and coordinating care related to the above assessment and plan.  Micheline Rough, MD  Please contact Palliative Medicine Team phone at [phone] for questions and concerns.

## 2023-02-15 IMAGING — DX DG CHEST 1V PORT
1 series · 1 of 1 positions shown · non-contrast
Comparison: 01/12/2021

CLINICAL DATA: Shortness of breath

EXAM:
PORTABLE CHEST 1 VIEW

[chest ap]
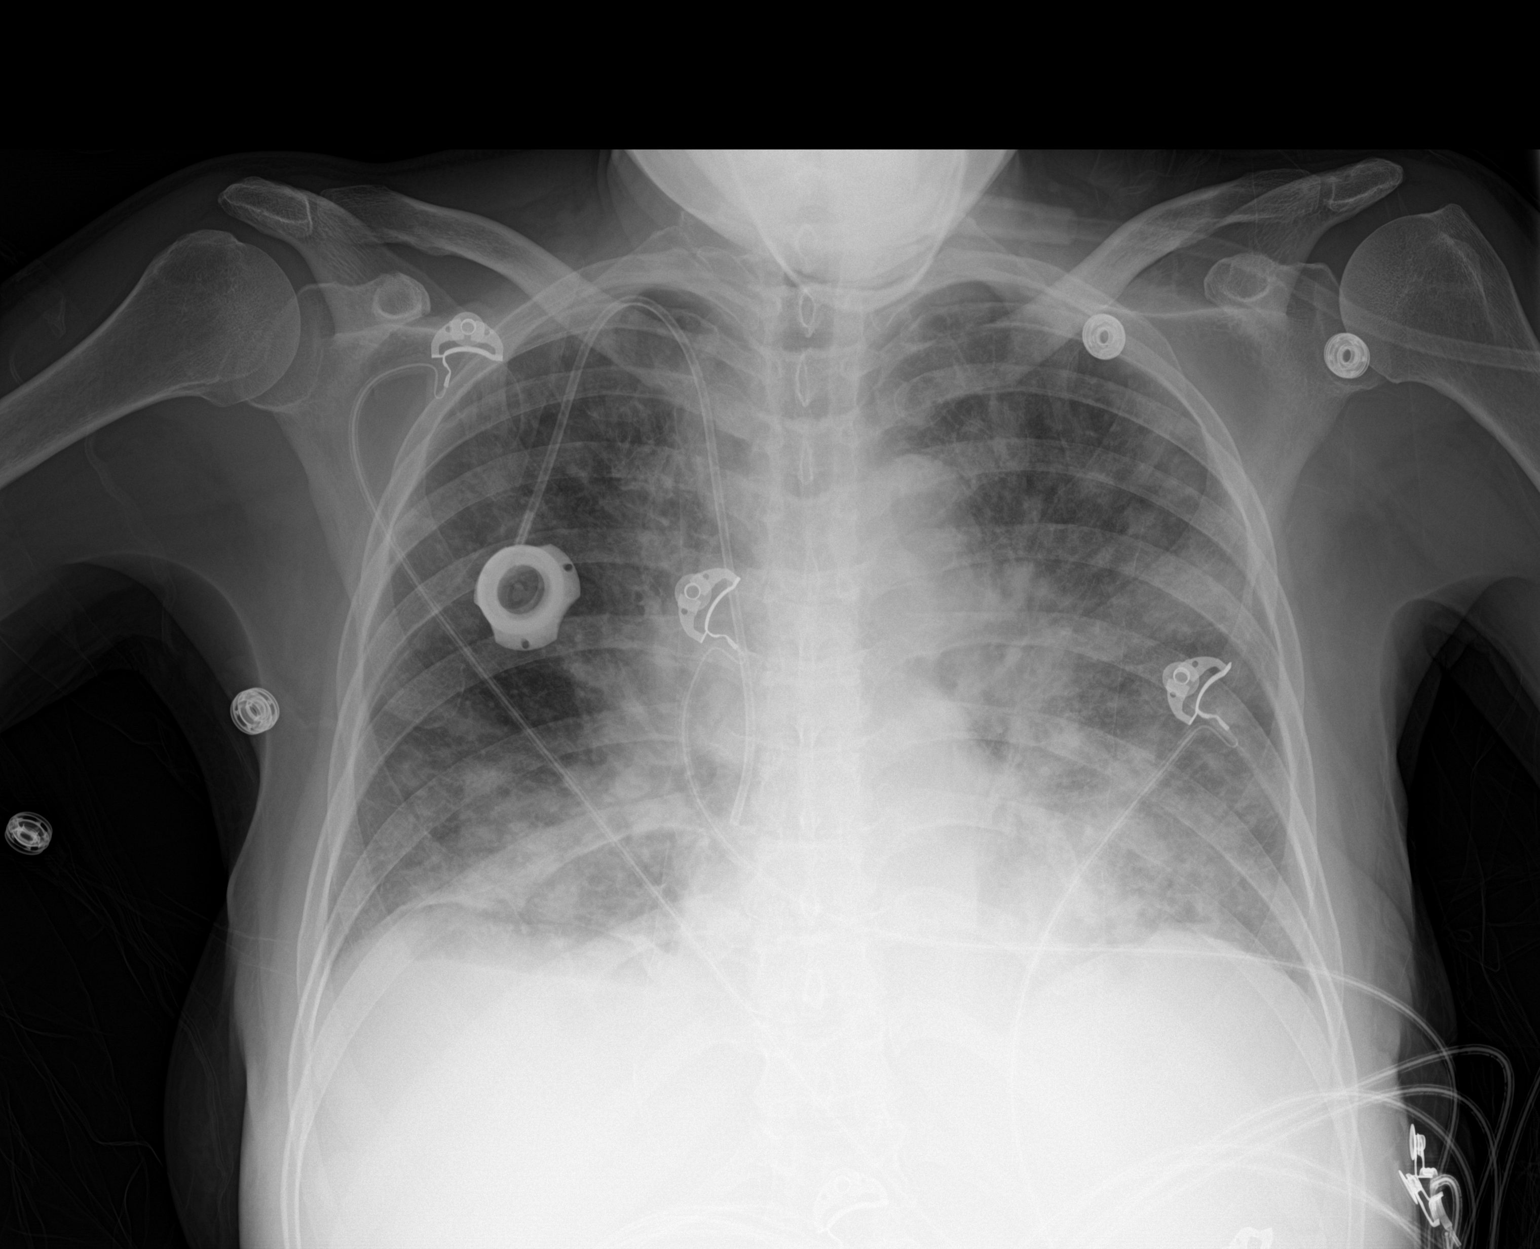

[1 of 1 positions shown; findings below may reference images not displayed]

FINDINGS: Right Port-A-Cath remains in place, unchanged. Nodular airspace
disease again noted throughout both lungs, worsening since prior
study. This could reflect pneumonia, metastatic disease, or a
combination. No visible effusions or pneumothorax.
IMPRESSION: Worsening bilateral airspace disease, some of which appears nodular.
This could reflect pneumonia, metastatic disease or a combination.
